# Patient Record
Sex: Male | Born: 1980 | Race: White | Hispanic: No | Marital: Married | Smoking: Never smoker
Health system: Southern US, Community
[De-identification: ages and names within clinical notes are randomized; demographics above are authoritative.]

## PROBLEM LIST (undated history)

## (undated) DIAGNOSIS — K219 Gastro-esophageal reflux disease without esophagitis: Secondary | ICD-10-CM

## (undated) DIAGNOSIS — F419 Anxiety disorder, unspecified: Secondary | ICD-10-CM

## (undated) DIAGNOSIS — J45909 Unspecified asthma, uncomplicated: Secondary | ICD-10-CM

## (undated) HISTORY — DX: Gastro-esophageal reflux disease without esophagitis: K21.9

## (undated) HISTORY — DX: Unspecified asthma, uncomplicated: J45.909

## (undated) HISTORY — DX: Anxiety disorder, unspecified: F41.9

---
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
## 2003-07-22 HISTORY — PX: FINGER DEBRIDEMENT: SHX1634

## 2008-05-11 ENCOUNTER — Emergency Department (HOSPITAL_COMMUNITY): Admission: EM | Admit: 2008-05-11 | Discharge: 2008-05-11 | Payer: Self-pay | Admitting: Emergency Medicine

## 2012-06-28 ENCOUNTER — Encounter (INDEPENDENT_AMBULATORY_CARE_PROVIDER_SITE_OTHER): Payer: Self-pay | Admitting: Surgery

## 2012-06-28 ENCOUNTER — Ambulatory Visit (INDEPENDENT_AMBULATORY_CARE_PROVIDER_SITE_OTHER): Payer: BC Managed Care – PPO | Admitting: Surgery

## 2012-06-28 VITALS — BP 126/80 | HR 76 | Resp 14 | Ht 70.0 in | Wt 263.0 lb

## 2012-06-28 DIAGNOSIS — J45909 Unspecified asthma, uncomplicated: Secondary | ICD-10-CM | POA: Insufficient documentation

## 2012-06-28 DIAGNOSIS — E669 Obesity, unspecified: Secondary | ICD-10-CM | POA: Insufficient documentation

## 2012-06-28 DIAGNOSIS — K620 Anal polyp: Secondary | ICD-10-CM

## 2012-06-28 DIAGNOSIS — K219 Gastro-esophageal reflux disease without esophagitis: Secondary | ICD-10-CM | POA: Insufficient documentation

## 2012-06-28 NOTE — Progress Notes (Signed)
Subjective:     Patient ID: Caleb Vega, male   DOB: 1981-04-29, 31 y.o.   MRN: [id number]  HPI  Caleb Vega  [birth year] [id number]  Patient Care Team: Clayborn Heron, MD as PCP - General (Family Medicine) Shirley Friar, MD as Consulting Physician (Gastroenterology)  This patient is a 31 y.o.male who presents today for surgical evaluation at the request of Dr. Bosie Clos.   Reason for visit: Mass in anal canal.  Pleasant male.   Has had a few episodes of bright red blood per rectum.  Persisted.  Also sometimes feels like a lump comes in and out with bowel movements.  Underwent colonoscopy.  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  Also found to have an atypical polyp in the anal canal.  Was sent to me for consideration of surgical removal.  Patient is rather active.  Moves his bowels 2-3 times a day since started Nexium for his reflux a few years ago.  However they are stable.  Can walk a few miles without difficulty.  No history of anal trauma.  No history of anal receptive sex.  No history of warts.  No history of STDs  Patient Active Problem List  Diagnosis  . Anal canal polyp  . GERD (gastroesophageal reflux disease)  . Asthma  . Obesity (BMI 30-39.9)    Past Medical History  Diagnosis Date  . GERD (gastroesophageal reflux disease)   . Asthma   . Anxiety     Past Surgical History  Procedure Date  . Wisdom tooth extraction 2001  . Finger debridement 07/2003    History   Social History  . Marital Status: Married    Spouse Name: N/A    Number of Children: N/A  . Years of Education: 12+   Occupational History  .      Works at [employer]   Social History Main Topics  . Smoking status: Never Smoker   . Smokeless tobacco: Not on file  . Alcohol Use: Yes     2 x monthly  . Drug Use: No  . Sexually Active: Not on file   Other Topics Concern  . Not on file   Social History Narrative  . No narrative on file      Family History  Problem Relation Age of Onset  . Lung cancer Maternal Grandfather   . Prostate cancer Paternal Grandfather     Current Outpatient Prescriptions  Medication Sig Dispense Refill  . esomeprazole (NEXIUM) 40 MG capsule Take 40 mg by mouth daily before breakfast.         Allergies  Allergen Reactions  . Codeine Nausea Only  . Oxycodone Nausea Only    BP 126/80  Pulse 76  Resp 14  Ht 5\' 10"  (1.778 m)  Wt 263 lb (119.296 kg)  BMI 37.74 kg/m2  No results found.   Review of Systems  Constitutional: Negative for fever, chills and diaphoresis.  HENT: Negative for nosebleeds, sore throat, facial swelling, mouth sores, trouble swallowing and ear discharge.   Eyes: Negative for photophobia, discharge and visual disturbance.  Respiratory: Negative for choking, chest tightness, shortness of breath and stridor.   Cardiovascular: Negative for chest pain and palpitations.  Gastrointestinal: Positive for diarrhea and anal bleeding. Negative for nausea, vomiting, abdominal pain, constipation, blood in stool, abdominal distention and rectal pain.  Genitourinary: Negative for dysuria, urgency, difficulty urinating and testicular pain.  Musculoskeletal: Negative for myalgias, back pain, arthralgias  and gait problem.  Skin: Negative for color change, pallor, rash and wound.  Neurological: Negative for dizziness, speech difficulty, weakness, numbness and headaches.  Hematological: Negative for adenopathy. Does not bruise/bleed easily.  Psychiatric/Behavioral: Negative for hallucinations, confusion and agitation.       Objective:   Physical Exam  Constitutional: He is oriented to person, place, and time. He appears well-developed and well-nourished. No distress.  HENT:  Head: Normocephalic.  Mouth/Throat: Oropharynx is clear and moist. No oropharyngeal exudate.  Eyes: Conjunctivae normal and EOM are normal. Pupils are equal, round, and reactive to light. No scleral  icterus.  Neck: Normal range of motion. Neck supple. No tracheal deviation present.  Cardiovascular: Normal rate, regular rhythm and intact distal pulses.   Pulmonary/Chest: Effort normal and breath sounds normal. No respiratory distress.  Abdominal: Soft. He exhibits no distension. There is no tenderness. Hernia confirmed negative in the right inguinal area and confirmed negative in the left inguinal area.  Genitourinary:       Exam done with assistance of male Medical Assistant in the room.  Perianal skin clean with good hygiene.  No pruritis.  No external skin tags / hemorrhoids of significance.  No pilonidal disease.  No fissure.  No abscess/fistula.    Tolerates digital and anoscopic rectal exam.  Normal sphincter tone.  Hemorrhoidal piles mildly enlarged R post>ant.  2cm polyp at base of R post hem pile - prolapses easily.  Hard/irregular tip   Musculoskeletal: Normal range of motion. He exhibits no tenderness.  Lymphadenopathy:    He has no cervical adenopathy.       Right: No inguinal adenopathy present.       Left: No inguinal adenopathy present.  Neurological: He is alert and oriented to person, place, and time. No cranial nerve deficit. He exhibits normal muscle tone. Coordination normal.  Skin: Skin is warm and dry. No rash noted. He is not diaphoretic. No erythema. No pallor.  Psychiatric: He has a normal mood and affect. His behavior is normal. Judgment and thought content normal.       Assessment:     Anal canal mass right posterior on top of hemorrhoidal pile.  Hopefully benign.    Plan:     Will I do think it is probably benign.  It is atypical in appearance.  The only way to know for certain what it is is to remove it.  Another option is to observe and see if it changes over the next year or so.  I more inclined to remove it.  Lithotomy position.  So is the patient.  I discussed the procedure:  The anatomy & physiology of the anorectal region was discussed.  The  pathophysiology of anorectal masses and differential diagnosis was discussed.  Natural history risks without surgery was discussed such as further growth and cancer.   I stressed the importance of office follow-up to catch early recurrence & minimize/halt progression of disease.  Interventions such as cauterization by topical agents were discussed.  The patient's symptoms are not adequately controlled by non-operative treatments.  I feel the risks & problems of no surgery outweigh the operative risks; therefore, I recommended surgery to treat the anal mass by removal.  Risks such as bleeding, infection, need for further treatment, heart attack, death, and other risks were discussed.   I noted a good likelihood this will help address the problem. Goals of post-operative recovery were discussed as well.  Possibility that this will not correct all symptoms was explained.  Post-operative pain, bleeding, constipation, and other problems after surgery were discussed.  We will work to minimize complications.   Educational handouts further explaining the pathology, treatment options, and bowel regimen were given as well.  Questions were answered.  The patient expresses understanding & wishes to proceed with surgery.  He was hoping to wait until the end of the year, May.  I am hesitant to wait that long.  He thinks there may be a window in January.  We will try and schedule it then

## 2012-06-28 NOTE — Patient Instructions (Addendum)
You have a polyp in your anal canal.  Hopefully benign.  Probably an atypical hemorrhoid.  We recommend removal.  Please call to schedule when you are ready.  GETTING TO GOOD BOWEL HEALTH. Irregular bowel habits such as constipation and diarrhea can lead to many problems over time.  Having one soft bowel movement a day is the most important way to prevent further problems.  The anorectal canal is designed to handle stretching and feces to safely manage our ability to get rid of solid waste (feces, poop, stool) out of our body.  BUT, hard constipated stools can act like ripping concrete bricks and diarrhea can be a burning fire to this very sensitive area of our body, causing inflamed hemorrhoids, anal fissures, increasing risk is perirectal abscesses, abdominal pain/bloating, an making irritable bowel worse.     The goal: ONE SOFT BOWEL MOVEMENT A DAY!  To have soft, regular bowel movements:    Drink at least 8 tall glasses of water a day.     Take plenty of fiber.  Fiber is the undigested part of plant food that passes into the colon, acting s "natures broom" to encourage bowel motility and movement.  Fiber can absorb and hold large amounts of water. This results in a larger, bulkier stool, which is soft and easier to pass. Work gradually over several weeks up to 6 servings a day of fiber (25g a day even more if needed) in the form of: o Vegetables -- Root (potatoes, carrots, turnips), leafy green (lettuce, salad greens, celery, spinach), or cooked high residue (cabbage, broccoli, etc) o Fruit -- Fresh (unpeeled skin & pulp), Dried (prunes, apricots, cherries, etc ),  or stewed ( applesauce)  o Whole grain breads, pasta, etc (whole wheat)  o Bran cereals    Bulking Agents -- This type of water-retaining fiber generally is easily obtained each day by one of the following:  o Psyllium bran -- The psyllium plant is remarkable because its ground seeds can retain so much water. This product is available as  Metamucil, Konsyl, Effersyllium, Per Diem Fiber, or the less expensive generic preparation in drug and health food stores. Although labeled a laxative, it really is not a laxative.  o Methylcellulose -- This is another fiber derived from wood which also retains water. It is available as Citrucel. o Polyethylene Glycol - and "artificial" fiber commonly called Miralax or Glycolax.  It is helpful for people with gassy or bloated feelings with regular fiber o Flax Seed - a less gassy fiber than psyllium   No reading or other relaxing activity while on the toilet. If bowel movements take longer than 5 minutes, you are too constipated   AVOID CONSTIPATION.  High fiber and water intake usually takes care of this.  Sometimes a laxative is needed to stimulate more frequent bowel movements, but    Laxatives are not a good long-term solution as it can wear the colon out. o Osmotics (Milk of Magnesia, Fleets phosphosoda, Magnesium citrate, MiraLax, GoLytely) are safer than  o Stimulants (Senokot, Castor Oil, Dulcolax, Ex Lax)    o Do not take laxatives for more than 7days in a row.    IF SEVERELY CONSTIPATED, try a Bowel Retraining Program: o Do not use laxatives.  o Eat a diet high in roughage, such as bran cereals and leafy vegetables.  o Drink six (6) ounces of prune or apricot juice each morning.  o Eat two (2) large servings of stewed fruit each day.  o  Take one (1) heaping tablespoon of a psyllium-based bulking agent twice a day. Use sugar-free sweetener when possible to avoid excessive calories.  o Eat a normal breakfast.  o Set aside 15 minutes after breakfast to sit on the toilet, but do not strain to have a bowel movement.  o If you do not have a bowel movement by the third day, use an enema and repeat the above steps.    Controlling diarrhea o Switch to liquids and simpler foods for a few days to avoid stressing your intestines further. o Avoid dairy products (especially milk & ice cream) for a  short time.  The intestines often can lose the ability to digest lactose when stressed. o Avoid foods that cause gassiness or bloating.  Typical foods include beans and other legumes, cabbage, broccoli, and dairy foods.  Every person has some sensitivity to other foods, so listen to our body and avoid those foods that trigger problems for you. o Adding fiber (Citrucel, Metamucil, psyllium, Miralax) gradually can help thicken stools by absorbing excess fluid and retrain the intestines to act more normally.  Slowly increase the dose over a few weeks.  Too much fiber too soon can backfire and cause cramping & bloating. o Probiotics (such as active yogurt, Align, etc) may help repopulate the intestines and colon with normal bacteria and calm down a sensitive digestive tract.  Most studies show it to be of mild help, though, and such products can be costly. o Medicines:   Bismuth subsalicylate (ex. Kayopectate, Pepto Bismol) every 30 minutes for up to 6 doses can help control diarrhea.  Avoid if pregnant.   Loperamide (Immodium) can slow down diarrhea.  Start with two tablets (4mg  total) first and then try one tablet every 6 hours.  Avoid if you are having fevers or severe pain.  If you are not better or start feeling worse, stop all medicines and call your doctor for advice o Call your doctor if you are getting worse or not better.  Sometimes further testing (cultures, endoscopy, X-ray studies, bloodwork, etc) may be needed to help diagnose and treat the cause of the diarrhea. o

## 2012-06-29 ENCOUNTER — Encounter (INDEPENDENT_AMBULATORY_CARE_PROVIDER_SITE_OTHER): Payer: Self-pay

## 2012-07-04 ENCOUNTER — Other Ambulatory Visit (INDEPENDENT_AMBULATORY_CARE_PROVIDER_SITE_OTHER): Payer: Self-pay | Admitting: Surgery

## 2012-09-05 DIAGNOSIS — D129 Benign neoplasm of anus and anal canal: Secondary | ICD-10-CM

## 2012-09-05 DIAGNOSIS — D128 Benign neoplasm of rectum: Secondary | ICD-10-CM

## 2012-09-07 ENCOUNTER — Telehealth (INDEPENDENT_AMBULATORY_CARE_PROVIDER_SITE_OTHER): Payer: Self-pay | Admitting: General Surgery

## 2012-09-07 NOTE — Telephone Encounter (Signed)
Pt called about a swelling that yesterday was the size of a pea and today is size of a grape.  It is protruding through his rectum.  He had anal polyp surgery at [facility] this week.  Pt reports he had BM yesterday but not yet today.  He is showering and soaking in warm tub water as well.  Pt wants to know what the swelling is and if it is ok.  Will contact Dr. Michaell Cowing and call pt back.

## 2012-09-07 NOTE — Telephone Encounter (Signed)
Called pt to discuss Dr. Gordy Savers response to his concern.  He was satisfied and will call back if any further concerns.

## 2012-09-07 NOTE — Telephone Encounter (Signed)
Postoperative swelling common.  Warm soaks/ice packs help.  Needs to be on high fiber w supplement & laxatives PRN to avoid constipation

## 2012-09-25 ENCOUNTER — Encounter (INDEPENDENT_AMBULATORY_CARE_PROVIDER_SITE_OTHER): Payer: Self-pay | Admitting: Surgery

## 2012-09-25 ENCOUNTER — Ambulatory Visit (INDEPENDENT_AMBULATORY_CARE_PROVIDER_SITE_OTHER): Payer: BC Managed Care – PPO | Admitting: Surgery

## 2012-09-25 VITALS — BP 122/88 | HR 88 | Temp 97.6°F | Resp 18 | Ht 70.0 in | Wt 263.2 lb

## 2012-09-25 DIAGNOSIS — K648 Other hemorrhoids: Secondary | ICD-10-CM

## 2012-09-25 DIAGNOSIS — K620 Anal polyp: Secondary | ICD-10-CM

## 2012-09-25 NOTE — Progress Notes (Signed)
Subjective:     Patient ID: Caleb Vega, male   DOB: [birth year], 32 y.o.   MRN: [id number]  HPI  ANTUAN LIMES  [birth year] [id number]  Patient Care Team: Clayborn Heron, MD as PCP - General (Family Medicine) Shirley Friar, MD as Consulting Physician (Gastroenterology)  This patient is a 32 y.o.male who presents today for surgical evaluation s/p excision of anal canal mass & hemorrhoidal ligation 09/05/2012   The patient comes in today feeling okay.  Still feels like something pulls and hurts with bowel movements or wiping.  Getting better now.  Having 3-4 bowel movements a day.  Somewhat loose.  That is his usual baseline.  Tylenol only helps take care of things.  No fevers chills or sweats.  Patient Active Problem List  Diagnosis  . Anal canal polyp  . GERD (gastroesophageal reflux disease)  . Asthma  . Obesity (BMI 30-39.9)  . Internal hemorrhoids with complication s/p ligation [id number]    Past Medical History  Diagnosis Date  . GERD (gastroesophageal reflux disease)   . Asthma   . Anxiety     Past Surgical History  Procedure Date  . Wisdom tooth extraction 2001  . Finger debridement 07/2003    History   Social History  . Marital Status: Married    Spouse Name: N/A    Number of Children: N/A  . Years of Education: 12+   Occupational History  .      Works at [employer]   Social History Main Topics  . Smoking status: Never Smoker   . Smokeless tobacco: Not on file  . Alcohol Use: Yes     Comment: 2 x monthly  . Drug Use: No  . Sexually Active: Not on file   Other Topics Concern  . Not on file   Social History Narrative  . No narrative on file    Family History  Problem Relation Age of Onset  . Lung cancer Maternal Grandfather   . Prostate cancer Paternal Grandfather     Current Outpatient Prescriptions  Medication Sig Dispense Refill  . esomeprazole (NEXIUM) 40 MG capsule Take 40 mg by mouth daily before breakfast.         Allergies  Allergen Reactions  . Codeine Nausea Only  . Oxycodone Nausea Only    BP 122/88  Pulse 88  Temp 97.6 F (36.4 C) (Temporal)  Resp 18  Ht 5\' 10"  (1.778 m)  Wt 263 lb 3.2 oz (119.387 kg)  BMI 37.77 kg/m2  No results found.   Review of Systems  Constitutional: Negative for fever, chills and diaphoresis.  HENT: Negative for sore throat, trouble swallowing and neck pain.   Eyes: Negative for photophobia and visual disturbance.  Respiratory: Negative for choking and shortness of breath.   Cardiovascular: Negative for chest pain and palpitations.  Gastrointestinal: Positive for diarrhea and rectal pain. Negative for nausea, vomiting, constipation, blood in stool, abdominal distention and anal bleeding.  Genitourinary: Negative for dysuria, urgency, difficulty urinating and testicular pain.  Musculoskeletal: Negative for myalgias, arthralgias and gait problem.  Skin: Negative for color change and rash.  Neurological: Negative for dizziness, speech difficulty, weakness and numbness.  Hematological: Negative for adenopathy.  Psychiatric/Behavioral: Negative for hallucinations, confusion and agitation.       Objective:   Physical Exam  Constitutional: He is oriented to person, place, and time. He appears well-developed and well-nourished. No distress.  HENT:  Head: Normocephalic.  Mouth/Throat: Oropharynx is clear and moist.  No oropharyngeal exudate.  Eyes: Conjunctivae normal and EOM are normal. Pupils are equal, round, and reactive to light. No scleral icterus.  Neck: Normal range of motion. No tracheal deviation present.  Cardiovascular: Normal rate, normal heart sounds and intact distal pulses.   Pulmonary/Chest: Effort normal. No respiratory distress.  Abdominal: Soft. He exhibits no distension. There is no tenderness. Hernia confirmed negative in the right inguinal area and confirmed negative in the left inguinal area.       Incisions clean with normal healing  ridges.  No hernias  Genitourinary: Penis normal. No penile tenderness.       Perianal skin clean with good hygiene.  No pruritis.  No external skin tags / hemorrhoids of significance.  No pilonidal disease.  No fissure.  No abscess/fistula.   Normal sphincter tone.    Exposed vicryl stitch R post anal wound - trimmed - feels much better now   Musculoskeletal: Normal range of motion. He exhibits no tenderness.  Neurological: He is alert and oriented to person, place, and time. No cranial nerve deficit. He exhibits normal muscle tone. Coordination normal.  Skin: Skin is warm and dry. No rash noted. He is not diaphoretic.  Psychiatric: He has a normal mood and affect. His behavior is normal.       Assessment:     S/p excision of anal canal mass - benign, recovering well    Plan:     Increase activity as tolerated to regular activity.  Do not push through pain.  Diet as tolerated. Bowel regimen to avoid problems.Consider adding Kaopectate or each bedtime Imodium to slow things down a little bit.  Avoid exacerbation.  Return to clinic 3weeks, p.r.n. If totally healed/asymptomatic   Instructions discussed.  Followup with primary care physician for other health issues as would normally be done.  Questions answered.  The patient expressed understanding and appreciation

## 2012-09-25 NOTE — Patient Instructions (Addendum)
HEMORRHOIDS   The rectum is the last few inches of your colon, and it naturally stretches to hold stool.  Hemorrhoidal piles are natural clusters of blood vessels that help the rectum stretch to hold stool and allow bowel movements to eliminate feces.  Hemorrhoids are abnormally swollen blood vessels in the rectum.  Too much pressure in the rectum causes hemorrhoids by forcing blood to stretch and bulge the walls of the veins, sometimes even rupturing them.  Hemorrhoids can become like varicose veins you might see on a person's legs. When bulging hemorrhoidal veins are irritated, they can swell, burn, itch, become very painful, and bleed. Once the rectal veins have been stretched out and hemorrhoids created, they are difficult to get rid of completely and tend to recur with less straining than it took to cause them in the first place. Fortunately, good habits and simple medical treatment usually control hemorrhoids well, and surgery is only recommended in unusually severe cases. Some of the most frequent causes of hemorrhoids:    Constant sitting    Straining with bowel movements (from constipation or hard stools)    Diarrhea    Sitting on the toilet for a long time    Severe coughing    Childbirth    Heavy Lifting  Types of Hemorrhoids:    Internal hemorrhoids usually don't hurt or itch; they are deep inside the rectum and usually have no sensation. However, internal hemorrhoids can bleed.  Such bleeding should not be ignored and mask blood from a dangerous source like colorectal cancer, so persistent rectal bleeding should be investigated with a colonoscopy.    External hemorrhoids cause most of the symptoms - pain, burning, and itching. Unirritated hemorrhoids can look like small skin tags coming out of the anus.     Thrombosed hemorrhoids can form when a hemorrhoid blood vessel bursts and causes the hemorrhoid to swell.  A purple blood clot can form in it and become an excruciatingly painful lump  at the anus. Because of these unpleasant symptoms, immediate incision and drainage by a surgeon at an office visit can provide much relief of the pain.    PREVENTION Avoiding the causes listed in above will prevent most cases of hemorrhoids, but this advice is sometimes hard to follow:  How can you avoid sitting all day if you have a seated job? Also, we try to avoid coughing and diarrhea, but sometimes it's beyond your control.  Still, there are some practical hints to help:    If your main job activity is seated, always stand or walk during your breaks. Make it a point to stand and walk at least 5 minutes every hour and try to shift frequently in your chair to avoid direct rectal pressure.    Always exhale as you strain or lift. Don't hold your breath.    Treat coughing, diarrhea and constipation early since irritated hemorrhoids may soon follow.    Do not delay or try to prevent a bowel movement when the urge is present.   Exercise regularly (walking or jogging 60 minutes a day) to stimulate the bowels to move.   Avoid dry toilet paper when cleaning after bowel movements.  Moistened tissues such as baby wipes are less irritating.  Lightly pat the rectal area dry.  Using irrigating showers or bottle irrigation washing can more gently clean this sensitive area.   Keep the anal and genital area clean and  dry.  Talcum or baby powders can help   GET  YOUR STOOLS SOFT.   This is the most important way to prevent irritated hemorrhoids.  Hard stools are like sandpaper to the anorectal canal and will cause more problems.   The goal: ONE SOFT BOWEL MOVEMENT A DAY!  To have soft, regular bowel movements:    Drink at least 8 tall glasses of water a day.     AVOID CONSTIPATION    Take plenty of fiber.  Fiber is the undigested part of plant food that passes into the colon, acting s "natures broom" to encourage bowel motility and movement.  Fiber can absorb and hold large amounts of water. This results in a  larger, bulkier stool, which is soft and easier to pass. Work gradually over several weeks up to 6 servings a day of fiber (25g a day even more if needed) in the form of: o Vegetables -- Root (potatoes, carrots, turnips), leafy green (lettuce, salad greens, celery, spinach), or cooked high residue (cabbage, broccoli, etc) o Fruit -- Fresh (unpeeled skin & pulp), Dried (prunes, apricots, cherries, etc ),  or stewed ( applesauce)  o Whole grain breads, pasta, etc (whole wheat)  o Bran cereals    Bulking Agents -- This type of water-retaining fiber generally is easily obtained each day by one of the following:  o Psyllium bran -- The psyllium plant is remarkable because its ground seeds can retain so much water. This product is available as Metamucil, Konsyl, Effersyllium, Per Diem Fiber, or the less expensive generic preparation in drug and health food stores. Although labeled a laxative, it really is not a laxative.  o Methylcellulose -- This is another fiber derived from wood which also retains water. It is available as Citrucel. o Polyethylene Glycol - and "artificial" fiber commonly called Miralax or Glycolax.  It is helpful for people with gassy or bloated feelings with regular fiber o Flax Seed - a less gassy fiber than psyllium   No reading or other relaxing activity while on the toilet. If bowel movements take longer than 5 minutes, you are too constipated   Laxatives can be useful for a short period if constipation is severe o Osmotics (Milk of Magnesia, Fleets phosphosoda, Magnesium citrate, MiraLax, GoLytely) are safer than  o Stimulants (Senokot, Castor Oil, Dulcolax, Ex Lax)    o Do not take laxatives for more than 7days in a row.   Laxatives are not a good long-term solution as it can stress the intestine and colon and causes too much mineral and fluid losses.    If badly constipated, try a Bowel Retraining Program: o Do not use laxatives.  o Eat a diet high in roughage, such as bran  cereals and leafy vegetables.  o Drink six (6) ounces of prune or apricot juice each morning.  o Eat two (2) large servings of stewed fruit each day.  o Take one (1) heaping dose of a bulking agent (ex. Metamucil, Citrucel, Miralax) twice a day.  o Use sugar-free sweetener when possible to avoid excessive calories.  o Eat a normal breakfast.  o Set aside 15 minutes after breakfast to sit on the toilet, but do not strain to have a bowel movement.  o If you do not have a bowel movement by the third day, use an enema and repeat the above steps.    AVOID DIARRHEA o Switch to liquids and simpler foods for a few days to avoid stressing your intestines further. o Avoid dairy products (especially milk & ice cream) for a short  time.  The intestines often can lose the ability to digest lactose when stressed. o Avoid foods that cause gassiness or bloating.  Typical foods include beans and other legumes, cabbage, broccoli, and dairy foods.  Every person has some sensitivity to other foods, so listen to our body and avoid those foods that trigger problems for you. o Adding fiber (Citrucel, Metamucil, psyllium, Miralax) gradually can help thicken stools by absorbing excess fluid and retrain the intestines to act more normally.  Slowly increase the dose over a few weeks.  Too much fiber too soon can backfire and cause cramping & bloating. o Probiotics (such as active yogurt, Align, etc) may help repopulate the intestines and colon with normal bacteria and calm down a sensitive digestive tract.  Most studies show it to be of mild help, though, and such products can be costly. o Medicines:   Bismuth subsalicylate (ex. Kayopectate, Pepto Bismol) every 30 minutes for up to 6 doses can help control diarrhea.  Avoid if pregnant.   Loperamide (Immodium) can slow down diarrhea.  Start with two tablets (4mg  total) first and then try one tablet every 6 hours.  Avoid if you are having fevers or severe pain.  If you are not  better or start feeling worse, stop all medicines and call your doctor for advice o Call your doctor if you are getting worse or not better.  Sometimes further testing (cultures, endoscopy, X-ray studies, bloodwork, etc) may be needed to help diagnose and treat the cause of the diarrhea.   If these preventive measures fail, you must take action right away! Hemorrhoids are one condition that can be mild in the morning and become intolerable by nightfall.       GETTING TO GOOD BOWEL HEALTH. Irregular bowel habits such as constipation and diarrhea can lead to many problems over time.  Having one soft bowel movement a day is the most important way to prevent further problems.  The anorectal canal is designed to handle stretching and feces to safely manage our ability to get rid of solid waste (feces, poop, stool) out of our body.  BUT, hard constipated stools can act like ripping concrete bricks and diarrhea can be a burning fire to this very sensitive area of our body, causing inflamed hemorrhoids, anal fissures, increasing risk is perirectal abscesses, abdominal pain/bloating, an making irritable bowel worse.     The goal: ONE SOFT BOWEL MOVEMENT A DAY!  To have soft, regular bowel movements:    Drink at least 8 tall glasses of water a day.     Take plenty of fiber.  Fiber is the undigested part of plant food that passes into the colon, acting s "natures broom" to encourage bowel motility and movement.  Fiber can absorb and hold large amounts of water. This results in a larger, bulkier stool, which is soft and easier to pass. Work gradually over several weeks up to 6 servings a day of fiber (25g a day even more if needed) in the form of: o Vegetables -- Root (potatoes, carrots, turnips), leafy green (lettuce, salad greens, celery, spinach), or cooked high residue (cabbage, broccoli, etc) o Fruit -- Fresh (unpeeled skin & pulp), Dried (prunes, apricots, cherries, etc ),  or stewed ( applesauce)   o Whole grain breads, pasta, etc (whole wheat)  o Bran cereals    Bulking Agents -- This type of water-retaining fiber generally is easily obtained each day by one of the following:  o Psyllium bran -- The psyllium  plant is remarkable because its ground seeds can retain so much water. This product is available as Metamucil, Konsyl, Effersyllium, Per Diem Fiber, or the less expensive generic preparation in drug and health food stores. Although labeled a laxative, it really is not a laxative.  o Methylcellulose -- This is another fiber derived from wood which also retains water. It is available as Citrucel. o Polyethylene Glycol - and "artificial" fiber commonly called Miralax or Glycolax.  It is helpful for people with gassy or bloated feelings with regular fiber o Flax Seed - a less gassy fiber than psyllium   No reading or other relaxing activity while on the toilet. If bowel movements take longer than 5 minutes, you are too constipated   AVOID CONSTIPATION.  High fiber and water intake usually takes care of this.  Sometimes a laxative is needed to stimulate more frequent bowel movements, but    Laxatives are not a good long-term solution as it can wear the colon out. o Osmotics (Milk of Magnesia, Fleets phosphosoda, Magnesium citrate, MiraLax, GoLytely) are safer than  o Stimulants (Senokot, Castor Oil, Dulcolax, Ex Lax)    o Do not take laxatives for more than 7days in a row.    IF SEVERELY CONSTIPATED, try a Bowel Retraining Program: o Do not use laxatives.  o Eat a diet high in roughage, such as bran cereals and leafy vegetables.  o Drink six (6) ounces of prune or apricot juice each morning.  o Eat two (2) large servings of stewed fruit each day.  o Take one (1) heaping tablespoon of a psyllium-based bulking agent twice a day. Use sugar-free sweetener when possible to avoid excessive calories.  o Eat a normal breakfast.  o Set aside 15 minutes after breakfast to sit on the toilet, but do  not strain to have a bowel movement.  o If you do not have a bowel movement by the third day, use an enema and repeat the above steps.    Controlling diarrhea o Switch to liquids and simpler foods for a few days to avoid stressing your intestines further. o Avoid dairy products (especially milk & ice cream) for a short time.  The intestines often can lose the ability to digest lactose when stressed. o Avoid foods that cause gassiness or bloating.  Typical foods include beans and other legumes, cabbage, broccoli, and dairy foods.  Every person has some sensitivity to other foods, so listen to our body and avoid those foods that trigger problems for you. o Adding fiber (Citrucel, Metamucil, psyllium, Miralax) gradually can help thicken stools by absorbing excess fluid and retrain the intestines to act more normally.  Slowly increase the dose over a few weeks.  Too much fiber too soon can backfire and cause cramping & bloating. o Probiotics (such as active yogurt, Align, etc) may help repopulate the intestines and colon with normal bacteria and calm down a sensitive digestive tract.  Most studies show it to be of mild help, though, and such products can be costly. o Medicines:   Bismuth subsalicylate (ex. Kayopectate, Pepto Bismol) every 30 minutes for up to 6 doses can help control diarrhea.  Avoid if pregnant.   Loperamide (Immodium) can slow down diarrhea.  Start with two tablets (4mg  total) first and then try one tablet every 6 hours.  Avoid if you are having fevers or severe pain.  If you are not better or start feeling worse, stop all medicines and call your doctor for advice o Call  your doctor if you are getting worse or not better.  Sometimes further testing (cultures, endoscopy, X-ray studies, bloodwork, etc) may be needed to help diagnose and treat the cause of the diarrhea.

## 2014-01-10 ENCOUNTER — Encounter (INDEPENDENT_AMBULATORY_CARE_PROVIDER_SITE_OTHER): Payer: Self-pay

## 2014-01-10 ENCOUNTER — Encounter: Payer: Self-pay | Admitting: Neurology

## 2014-01-10 ENCOUNTER — Ambulatory Visit (INDEPENDENT_AMBULATORY_CARE_PROVIDER_SITE_OTHER): Payer: BC Managed Care – PPO | Admitting: Neurology

## 2014-01-10 VITALS — BP 136/96 | HR 81 | Temp 99.1°F | Ht 70.0 in | Wt 280.0 lb

## 2014-01-10 DIAGNOSIS — G4763 Sleep related bruxism: Secondary | ICD-10-CM

## 2014-01-10 DIAGNOSIS — G2581 Restless legs syndrome: Secondary | ICD-10-CM

## 2014-01-10 DIAGNOSIS — R0609 Other forms of dyspnea: Secondary | ICD-10-CM

## 2014-01-10 DIAGNOSIS — R0989 Other specified symptoms and signs involving the circulatory and respiratory systems: Secondary | ICD-10-CM

## 2014-01-10 DIAGNOSIS — R0683 Snoring: Secondary | ICD-10-CM

## 2014-01-10 DIAGNOSIS — R51 Headache: Secondary | ICD-10-CM

## 2014-01-10 DIAGNOSIS — E669 Obesity, unspecified: Secondary | ICD-10-CM

## 2014-01-10 DIAGNOSIS — R519 Headache, unspecified: Secondary | ICD-10-CM

## 2014-01-10 NOTE — Progress Notes (Signed)
Subjective:    Vega ID: Caleb Vega is a 33 y.o. male.  HPI    Star Age, MD, PhD [facility] [address], [address] [place], [place] [address]  Dear Dr. Radene Ou,   I saw your Vega, Caleb Vega, upon your kind request in my neurologic clinic today for initial consultation of his sleep disorder, in particular, concern for obstructive sleep apnea. Caleb Vega is unaccompanied today. As you know, Caleb Vega is a 33 year old right-handed gentleman with an underlying medical history of reflux disease, asthma, anxiety, and obesity, who is required to have sleep evaluation for DOT requirements. He does report recurrent morning headaches and daytime somnolence. He snores mildly. His wife does not seem to complain about it. He's not sure if he has any breathing pauses while asleep and denies any overt gasping sensations while asleep.  His typical bedtime is reported to be around 9 to 10:30 PM and usual wake time is around 6:45 to 7:30 AM. Sleep onset typically occurs within 15-30 minutes. He reports feeling marginally rested upon awakening. He wakes up on an average 1 times in Caleb middle of Caleb night and has to go to Caleb bathroom 0 times on a typical night. He reports frequent morning headaches, which are L sided.  He denies frank excessive daytime somnolence (EDS) and His Epworth Sleepiness Score (ESS) is 5/24 today. He has not fallen asleep while driving. Caleb Vega has not been taking a planned nap. 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 He himself does not note any gasping sensations while asleep. Somehow or another he still does not wake up rested. He does achieve a good amount of sleep. His mother has sleep apnea and is on a CPAP machine. His mother also has RLS symptoms.  He has some restless leg symptoms and describes an aching sensation encouraged to move his right leg only. It helps to get up and walk around and stretch his right leg after which his symptoms subside typically. He grinds his teeth and has been using an over-Caleb-counter bite guard. He wakes up with nightmares sometimes. He does talk in his sleep. He does not walk in his sleep. He has acid reflux symptoms sometimes and often wakes up with a dry mouth from mouth breathing and has mucus. He does not typically suffer from postnasal drip. He drinks quite a bit of caffeine. He drinks soda approximately 4 times a day, and tries not to drink any caffeine after 4 PM. He does not smoke and rarely drinks alcohol.  His Past Medical History Is Significant For: Past Medical History  Diagnosis Date  . GERD (gastroesophageal reflux disease)   . Asthma   . Anxiety     His Past Surgical History Is Significant For: Past Surgical History  Procedure Laterality Date  . Wisdom tooth extraction  2001  . Finger debridement  07/2003    His Family History Is Significant For: Family History  Problem Relation Age of Onset  . Lung cancer Maternal Grandfather   . Prostate cancer Paternal Grandfather   . Sleep apnea Mother   . Diabetes Paternal Grandmother   . Arthritis Father     His Social History Is Significant For: History   Social History  . Marital Status: Married    Spouse Name:  N/A    Number of Children: N/A  . Years of Education: 12+   Occupational History  .      Works at [employer] Topics  . Smoking status: Never Smoker   . Smokeless tobacco: None  . Alcohol Use: Yes     Comment: 2 x monthly  . Drug Use: No  . Sexual Activity: None   Other Topics Concern  . None   Social History Narrative  . None    His Allergies Are:  Allergies  Allergen Reactions  . Codeine Nausea Only  . Oxycodone Nausea Only  :   His Current Medications Are:  Outpatient  Encounter Prescriptions as of 01/10/2014  Medication Sig  . esomeprazole (NEXIUM) 40 MG capsule Take 40 mg by mouth daily before breakfast.  . Multiple Vitamin (MULTIVITAMIN) tablet Take 1 tablet by mouth daily.  :  Review of Systems:  Out of a complete 14 point review of systems, all are reviewed and negative with Caleb exception of these symptoms as listed below:   Review of Systems  Constitutional: Positive for fatigue.  Eyes: Negative.   Respiratory: Negative.   Cardiovascular: Positive for chest pain.  Gastrointestinal: Negative.   Endocrine: Negative.   Genitourinary: Negative.   Musculoskeletal: Negative.   Skin: Negative.   Allergic/Immunologic: Negative.   Neurological: Positive for headaches.  Hematological: Negative.   Psychiatric/Behavioral: Positive for sleep disturbance (restless leg).    Objective:  Neurologic Exam  Physical Exam Physical Examination:   Filed Vitals:   01/10/14 1100  BP: 136/96  Pulse: 81  Temp: 99.1 F (37.3 C)    General Examination: Caleb Vega is a very pleasant 33 y.o. male in no acute distress. He appears well-developed and well-nourished and well groomed.   HEENT: Normocephalic, atraumatic, pupils are equal, round and reactive to light and accommodation. Funduscopic exam is normal with sharp disc margins noted. Extraocular tracking is good without limitation to gaze excursion or nystagmus noted. Normal smooth pursuit is noted. Hearing is grossly intact. Tympanic membranes are clear bilaterally. Face is symmetric with normal facial animation and normal facial sensation. Speech is clear with no dysarthria noted. There is no hypophonia. There is no lip, neck/head, jaw or voice tremor. Neck is supple with full range of passive and active motion. There are no carotid bruits on auscultation. Oropharynx exam reveals: No significant mouth dryness, good dental hygiene and mild to perhaps moderate airway crowding, due to thicker tone, and elongated  uvula as well as tonsils present. Mallampati is class II. Tongue protrudes centrally and palate elevates symmetrically. Tonsils are 1+ in size. Neck size is 17.75 inches.   Chest: Clear to auscultation without wheezing, rhonchi or crackles noted.  Heart: S1+S2+0, regular and normal without murmurs, rubs or gallops noted.   Abdomen: Soft, non-tender and non-distended with normal bowel sounds appreciated on auscultation.  Extremities: There is no pitting edema in Caleb distal lower extremities bilaterally. Pedal pulses are intact.  Skin: Warm and dry without trophic changes noted. There are no varicose veins.  Musculoskeletal: exam reveals no obvious joint deformities, tenderness or joint swelling or erythema.   Neurologically:  Mental status: Caleb Vega is awake, alert and oriented in all 4 spheres. His immediate and remote memory, attention, language skills and fund of knowledge are appropriate. There is no evidence of aphasia, agnosia, apraxia or anomia. Speech is clear with normal prosody and enunciation. Thought process is linear. Mood is normal and affect is normal.  Cranial nerves II - XII are as described above under HEENT exam. In addition: shoulder shrug is normal with equal shoulder height noted. Motor exam: Normal bulk, strength and tone is noted. There is no drift, tremor or rebound. Romberg is negative. Reflexes are 2+ throughout. Babinski: Toes are flexor bilaterally. Fine motor skills and coordination: intact with normal finger taps, normal hand movements, normal rapid alternating patting, normal foot taps and normal foot agility.  Cerebellar testing: No dysmetria or intention tremor on finger to nose testing. Heel to shin is unremarkable bilaterally. There is no truncal or gait ataxia.  Sensory exam: intact to light touch, pinprick, vibration, temperature sense in Caleb upper and lower extremities.  Gait, station and balance: He stands easily. No veering to one side is noted. No  leaning to one side is noted. Posture is age-appropriate and stance is narrow based. Gait shows normal stride length and normal pace. No problems turning are noted. He turns en bloc. Tandem walk is unremarkable. Intact toe and heel stance is noted.               Assessment and Plan:   In summary, Caleb Vega is a very pleasant 33 y.o.-year old male with a history and physical exam concerning for obstructive sleep apnea (OSA), in that he is overweight, snores some, has a larger neck size, has a FHx of OSA and report AM HAs. While he does not have a telltale complaint of severe daytime somnolence, gasping for air and crescendo snoring, I think it is imperative that we check him for sleep apnea; per DOT requirements he also needs to have a sleep study documented. I had a long chat with Caleb Vega about my findings and Caleb diagnosis of OSA, its prognosis and treatment options. We talked about medical treatments, surgical interventions and non-pharmacological approaches. I explained in particular Caleb risks and ramifications of untreated moderate to severe OSA, especially with respect to developing cardiovascular disease down Caleb Road, including congestive heart failure, difficult to treat hypertension, cardiac arrhythmias, or stroke. Even type 2 diabetes has, in part, been linked to untreated OSA. Symptoms of untreated OSA include daytime sleepiness, memory problems, mood irritability and mood disorder such as depression and anxiety, lack of energy, as well as recurrent headaches, especially morning headaches. We talked about trying to maintain a healthy lifestyle in general, as well as Caleb importance of weight control. I encouraged Caleb Vega to eat healthy, exercise daily and keep well hydrated, to keep a scheduled bedtime and wake time routine, to not skip any meals and eat healthy snacks in between meals. I advised Caleb Vega not to drive when feeling sleepy. I recommended Caleb following at this time:  sleep study with potential positive airway pressure titration.  I explained Caleb sleep test procedure to Caleb Vega and also outlined possible surgical and non-surgical treatment options of OSA, including Caleb use of a custom-made dental device (which would require a referral to a specialist dentist or oral surgeon), upper airway surgical options, such as pillar implants, radiofrequency surgery, tongue base surgery, and UPPP (which would involve a referral to an ENT surgeon). Rarely, jaw surgery such as mandibular advancement may be considered.  I also explained Caleb CPAP treatment option to Caleb Vega, who indicated that he would be willing to try CPAP if Caleb need arises. I explained Caleb importance of being compliant with PAP treatment, not only for insurance purposes but primarily to improve His symptoms, and for Caleb Vega's long term health  benefit, including to reduce His cardiovascular risks. I answered all his questions today and Caleb Vega was in agreement. I would like to see him back after Caleb sleep study is completed and encouraged him to call with any interim questions, concerns, problems or updates.   Thank you very much for allowing me to participate in Caleb care of this nice Vega. If I can be of any further assistance to you please do not hesitate to call me at [phone].  Sincerely,   Star Age, MD, PhD

## 2014-01-10 NOTE — Patient Instructions (Signed)

## 2014-01-13 ENCOUNTER — Ambulatory Visit (INDEPENDENT_AMBULATORY_CARE_PROVIDER_SITE_OTHER): Payer: BC Managed Care – PPO | Admitting: Neurology

## 2014-01-13 DIAGNOSIS — E669 Obesity, unspecified: Secondary | ICD-10-CM

## 2014-01-13 DIAGNOSIS — R519 Headache, unspecified: Secondary | ICD-10-CM

## 2014-01-13 DIAGNOSIS — R51 Headache: Secondary | ICD-10-CM

## 2014-01-13 DIAGNOSIS — R0683 Snoring: Secondary | ICD-10-CM

## 2014-01-13 DIAGNOSIS — G2581 Restless legs syndrome: Secondary | ICD-10-CM

## 2014-01-13 DIAGNOSIS — G4763 Sleep related bruxism: Secondary | ICD-10-CM

## 2014-01-13 DIAGNOSIS — G4733 Obstructive sleep apnea (adult) (pediatric): Secondary | ICD-10-CM

## 2014-01-23 NOTE — Sleep Study (Signed)
See media tab for full report  

## 2014-01-25 ENCOUNTER — Telehealth: Payer: Self-pay | Admitting: Neurology

## 2014-01-25 DIAGNOSIS — G4733 Obstructive sleep apnea (adult) (pediatric): Secondary | ICD-10-CM

## 2014-01-25 NOTE — Telephone Encounter (Signed)
Please call and notify patient that the recent sleep study confirmed the diagnosis of OSA. He did well with CPAP during the study with significant improvement of the respiratory events. Therefore, I would like start the patient on CPAP at home. I placed the order in the chart.   Arrange for CPAP set up at home through a DME company of patient's choice and fax/route report to PCP and referring MD (if other than PCP). Please expedite as the patient has a DOT card which will expire.   The patient will also need a follow up appointment with me in 6-8 weeks post set up that has to be scheduled; help the patient schedule this (in a follow-up slot).   Please re-enforce the importance of compliance with treatment and the need for [place] to monitor compliance data.   Once you have spoken to the patient and scheduled the return appointment, you may close this encounter, thanks,   Star Age, MD, PhD [facility] ([place])

## 2014-01-30 NOTE — Telephone Encounter (Signed)
I called and spoke with the patient about his recent sleep study results.I informed the patient that the study confirmed the diagnosis of obstructive sleep apnea and he did well on CPAP during the night of his study with significant improvement of the respiratory events. I will fax his CPAP order to [facility] and fax a copy of the report to Dr. Charlesetta Ivory office and to the patient along with a follow up instruction letter.

## 2014-01-31 ENCOUNTER — Encounter: Payer: Self-pay | Admitting: *Deleted

## 2014-04-19 ENCOUNTER — Encounter: Payer: Self-pay | Admitting: Neurology

## 2014-04-30 NOTE — Progress Notes (Signed)
Quick Note:  I reviewed the patient's CPAP compliance data from 03/19/2014 to 04/17/2014, which is a total of 30 days, during which time the patient used CPAP every day. The average usage for all days was 7 hours and 37 minutes. The percent used days greater than 4 hours was 93 %, indicating excellent compliance. The residual AHI was 1.8 per hour, indicating an adequate treatment pressure of 9 cwp with EPR of 1. Air leak from the mask was very low. I will review this data with the patient at the next office visit, which is currently not scheduled. We will get in touch with the patient so he will make a followup appointment.  Star Age, MD, PhD [facility] ([facility])   ______

## 2014-07-01 ENCOUNTER — Encounter: Payer: Self-pay | Admitting: Neurology

## 2014-07-17 NOTE — Progress Notes (Signed)
Quick Note:  I reviewed the patient's CPAP compliance data from 06/01/2014 to 06/30/2014, which is a total of 30 days, during which time the patient used CPAP every day except for 2 days. The average usage for all days was 7 hours and 17 minutes. The percent used days greater than 4 hours was 90 %, indicating excellent compliance. The residual AHI was 2.8 per hour, indicating an appropriate treatment pressure of 9 cwp with EPR of 1. Air leak from the mask was acceptable and typically low. I will review this data with the patient at the next office visit, which is currently not scheduled. We will get in touch with the patient regarding his follow-up appointment at which time I will provide feedback and additional troubleshooting if need be.  Star Age, MD, PhD [facility] ([facility])   ______

## 2014-07-26 ENCOUNTER — Encounter: Payer: Self-pay | Admitting: Neurology

## 2014-07-29 ENCOUNTER — Encounter: Payer: Self-pay | Admitting: Neurology

## 2014-07-29 ENCOUNTER — Ambulatory Visit (INDEPENDENT_AMBULATORY_CARE_PROVIDER_SITE_OTHER): Payer: BC Managed Care – PPO | Admitting: Neurology

## 2014-07-29 VITALS — BP 129/82 | HR 91 | Temp 98.6°F | Ht 70.0 in | Wt 296.0 lb

## 2014-07-29 DIAGNOSIS — E669 Obesity, unspecified: Secondary | ICD-10-CM

## 2014-07-29 DIAGNOSIS — G4733 Obstructive sleep apnea (adult) (pediatric): Secondary | ICD-10-CM

## 2014-07-29 DIAGNOSIS — Z9989 Dependence on other enabling machines and devices: Principal | ICD-10-CM

## 2014-07-29 NOTE — Patient Instructions (Signed)
Please continue using your CPAP regularly. While your insurance requires that you use CPAP at least 4 hours each night on 70% of the nights, I recommend, that you not skip any nights and use it throughout the night if you can. Getting used to CPAP and staying with the treatment long term does take time and patience and discipline. Untreated obstructive sleep apnea when it is moderate to severe can have an adverse impact on cardiovascular health and raise her risk for heart disease, arrhythmias, hypertension, congestive heart failure, stroke and diabetes. Untreated obstructive sleep apnea causes sleep disruption, nonrestorative sleep, and sleep deprivation. This can have an impact on your day to day functioning and cause daytime sleepiness and impairment of cognitive function, memory loss, mood disturbance, and problems focussing. Using CPAP regularly can improve these symptoms.  You do not have to bring your machine next time. Just bring the SD card.   Keep up the good work! I will see you back in 6 months for sleep apnea check up, and if you continue to do well on CPAP I will see you once a year thereafter.

## 2014-07-29 NOTE — Progress Notes (Signed)
Subjective:    Patient ID: Caleb Vega is a 33 y.o. male.  HPI     Interim history:   Caleb Vega is a 33 year old right-handed gentleman with an underlying medical history of reflux disease, asthma, anxiety, and obesity, who presents for follow-up consultation of his obstructive sleep apnea. He is unaccompanied today. 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 He reported recurrent morning headaches and daytime somnolence with mild snoring reported. I asked him to return for a sleep study. He had a split-night sleep study on 01/13/2014 and went over his test results with him in detail today. His baseline sleep efficiency was reduced at 80.7% with a latency to sleep of 11 minutes and wake after sleep onset of 18 minutes with moderate sleep fragmentation noted. He had an elevated arousal index. He had elevated stage II sleep, 21.5% of slow-wave sleep, and absence of REM sleep. He had moderate snoring. Total AHI was 39.2 per hour. Baseline oxygen saturation was 93%, nadir was 88%. He was then titrated on CPAP. Sleep efficiency was 82.7%, latency to sleep of 17.5 minutes and wake after sleep onset of 36.5 minutes with mild sleep fragmentation noted. His arousal index was improved. There was an increased percentage of REM sleep. Average oxygen saturation was 94%, nadir of 84%. He had no significant periodic leg movements of sleep before or after CPAP. He was titrated from 5-9 cm of water. AHI was reduced to 0.7 per hour at the final pressure with supine REM sleep achieved.   I reviewed the patient's CPAP compliance data from 03/19/2014 to 04/17/2014, which is a total of 30 days, during which time the patient used CPAP every day. The average usage for all days was 7 hours and 37 minutes. The percent used days greater than 4 hours was 93 %, indicating excellent compliance. The residual AHI was  1.8 per hour, indicating an adequate treatment pressure of 9 cwp with EPR of 1. Air leak from the mask was very low.  I reviewed the patient's CPAP compliance data from 06/01/2014 to 06/30/2014, which is a total of 30 days, during which time the patient used CPAP every day except for 2 days. The average usage for all days was 7 hours and 17 minutes. The percent used days greater than 4 hours was 90 %, indicating excellent compliance. The residual AHI was 2.8 per hour, indicating an appropriate treatment pressure of 9 cwp with EPR of 1. Air leak from the mask was acceptable and typically low.  Today, I reviewed his compliance data from 06/26/2014 through 07/25/2014 which is a total of 30 days during which time he used his machine every night except for one night. Percent used days greater than 4 hours was 93%, indicating excellent compliance, residual AHI 2.4 per hour, leak low. Pressure at 9 cm with EPR of 1. Average usage of 7 hours and 37 minutes.  Today, he reports that he doing well, he sleeps better and more consolidate, feels well rested. He has some nasal soreness and mild congestion. His nocturia is less, his headaches in the mornings are gone.  His typical bedtime is reported to be around 9 to 10:30 PM and usual wake time is around 6:45 to 7:30 AM. Sleep onset typically occurs within 15-30 minutes. He reports feeling marginally rested upon awakening. He wakes up on an average 1 times in the middle of  the night and has to go to the bathroom 0 times on a typical night. He reports frequent morning headaches, which are L sided.   He denies frank excessive daytime somnolence (EDS) and His Epworth Sleepiness Score (ESS) is 5/24 today. He has not fallen asleep while driving. The patient has not been taking a planned nap. 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 He himself does not note any gasping sensations while asleep. Somehow or another he still does not wake up rested. He does achieve a good amount of sleep. His mother has sleep apnea and is on a CPAP machine. His mother also has RLS symptoms. He has some restless leg symptoms and describes an aching sensation encouraged to move his right leg only. It helps to get up and walk around and stretch his right leg after which his symptoms subside typically. He grinds his teeth and has been using an over-the-counter bite guard. He wakes up with nightmares sometimes. He does talk in his sleep. He does not walk in his sleep. He has acid reflux symptoms sometimes and often wakes up with a dry mouth from mouth breathing and has mucus. He does not typically suffer from postnasal drip. He drinks quite a bit of caffeine. He drinks soda approximately 4 times a day, and tries not to drink any caffeine after 4 PM. He does not smoke and rarely drinks alcohol.   His Past Medical History Is Significant For: Past Medical History  Diagnosis Date  . GERD (gastroesophageal reflux disease)   . Asthma   . Anxiety     His Past Surgical History Is Significant For: Past Surgical History  Procedure Laterality Date  . Wisdom tooth extraction  2001  . Finger debridement  07/2003    His Family History Is Significant For: Family History  Problem Relation Age of Onset  . Lung cancer Maternal Grandfather   . Prostate cancer Paternal Grandfather   . Sleep apnea Mother   . Diabetes Paternal Grandmother   . Arthritis Father     His Social History Is Significant For: History   Social History  . Marital Status: Married    Spouse Name: Caleb Vega    Number of Children: N/A  . Years of Education: 12+   Occupational History  .      Works at [employer]  . Smoking status: Never Smoker   . Smokeless tobacco: Never Used  . Alcohol Use: 0.0 oz/week     0 Not specified per week     Comment: 2 x monthly  . Drug Use: No  . Sexual Activity: None   Other Topics Concern  . None   Social History Narrative   Patient consumes 2-3 daily    His Allergies Are:  Allergies  Allergen Reactions  . Codeine Nausea Only  . Oxycodone Nausea Only  :   His Current Medications Are:  Outpatient Encounter Prescriptions as of 07/29/2014  Medication Sig  . Coenzyme Q10 (COQ-10 PO) Take by mouth daily.  Marland Kitchen esomeprazole (NEXIUM) 20 MG capsule Take 20 mg by mouth daily at 12 noon.  . Multiple Vitamin (MULTIVITAMIN) tablet Take 1 tablet by mouth daily.  . [DISCONTINUED] esomeprazole (NEXIUM) 40 MG capsule Take 40 mg by mouth daily before breakfast.  :  Review of Systems:  Out of a complete 14 point review of systems, all are reviewed and negative with the exception of these symptoms as listed below:   Review of Systems  Allergic/Immunologic: Positive for environmental allergies.    Objective:  Neurologic Exam  Physical Exam Physical Examination:   Filed Vitals:   07/29/14 0807  BP: 129/82  Pulse: 91  Temp: 98.6 F (37 C)    General Examination: The patient is a very pleasant 33 y.o. male in no acute distress. He appears well-developed and well-nourished and well groomed.   HEENT: Normocephalic, atraumatic, pupils are equal, round and reactive to light and accommodation. Funduscopic exam is normal with sharp disc margins noted. Extraocular tracking is good without limitation to gaze excursion or nystagmus noted. Normal smooth pursuit is noted. Hearing is grossly intact. Face is symmetric with normal facial animation and normal facial sensation. Speech is clear with no dysarthria noted. There is no hypophonia. There is no lip, neck/head, jaw or voice tremor. Neck is supple with full range of passive and active motion. There are no carotid bruits on auscultation. Oropharynx exam reveals: No significant mouth dryness, good dental hygiene and mild  to perhaps moderate airway crowding, due to thicker tone, and elongated uvula as well as tonsils present. Mallampati is class II. Tongue protrudes centrally and palate elevates symmetrically. Tonsils are 1+ in size.    Chest: Clear to auscultation without wheezing, rhonchi or crackles noted.  Heart: S1+S2+0, regular and normal without murmurs, rubs or gallops noted.   Abdomen: Soft, non-tender and non-distended with normal bowel sounds appreciated on auscultation.  Extremities: There is no pitting edema in the distal lower extremities bilaterally. Pedal pulses are intact.  Skin: Warm and dry without trophic changes noted. There are no varicose veins.  Musculoskeletal: exam reveals no obvious joint deformities, tenderness or joint swelling or erythema.   Neurologically:  Mental status: The patient is awake, alert and oriented in all 4 spheres. His immediate and remote memory, attention, language skills and fund of knowledge are appropriate. There is no evidence of aphasia, agnosia, apraxia or anomia. Speech is clear with normal prosody and enunciation. Thought process is linear. Mood is normal and affect is normal.  Cranial nerves II - XII are as described above under HEENT exam. In addition: shoulder shrug is normal with equal shoulder height noted. Motor exam: Normal bulk, strength and tone is noted. There is no drift, tremor or rebound. Romberg is negative. Reflexes are 2+ throughout. Babinski: Toes are flexor bilaterally. Fine motor skills and coordination: intact with normal finger taps, normal hand movements, normal rapid alternating patting, normal foot taps and normal foot agility.  Cerebellar testing: No dysmetria or intention tremor on finger to nose testing. Heel to shin is unremarkable bilaterally. There is no truncal or gait ataxia.  Sensory exam: intact to light touch, pinprick, vibration, temperature sense in the upper and lower extremities.  Gait, station and balance: He stands  easily. No veering to one side is noted. No leaning to one side is noted. Posture is age-appropriate and stance is narrow based. Gait shows normal stride length and normal pace. No problems turning are noted. He turns en bloc. Tandem walk is unremarkable. Intact toe and heel stance is noted.               Assessment and Plan:   In summary, Caleb Vega is a very pleasant 33 year old male with an underlying medical history of reflux disease, asthma, anxiety, and  obesity, who presents for follow-up consultation of his obstructive sleep apnea,now on treatment with CPAP therapy since May. We talked about his sleep study results from end of April. I explained his compliance data to him as well. He has unfortunately gained weight. He's working on weight loss.he reports great results with CPAP therapy and that his sleep is better consolidated, his daytime somnolence is better and his morning headaches are essentially gone. We talked about trying to maintain a healthy lifestyle in general, as well as the importance of weight control. I encouraged the patient to eat healthy, exercise daily and keep well hydrated, to keep a scheduled bedtime and wake time routine, to not skip any meals and eat healthy snacks in between meals. I advised the patient not to drive when feeling sleepy.I commended him for being compliant with CPAP therapy. I also explained the importance of being compliant with PAP treatment, not only for insurance purposes but primarily to improve His symptoms, and for the patient's long term health benefit, including to reduce His cardiovascular riskssince he is doing well I would like to see him back in 6 months from now, sooner if the need arises.I answered all his questions today and the patient was in agreement.

## 2014-09-23 ENCOUNTER — Encounter: Payer: Self-pay | Admitting: Neurology

## 2015-01-29 ENCOUNTER — Ambulatory Visit (INDEPENDENT_AMBULATORY_CARE_PROVIDER_SITE_OTHER): Payer: BLUE CROSS/BLUE SHIELD | Admitting: Neurology

## 2015-01-29 ENCOUNTER — Encounter: Payer: Self-pay | Admitting: Neurology

## 2015-01-29 VITALS — BP 134/82 | HR 78 | Resp 18 | Ht 70.0 in | Wt 280.0 lb

## 2015-01-29 DIAGNOSIS — R0981 Nasal congestion: Secondary | ICD-10-CM | POA: Diagnosis not present

## 2015-01-29 DIAGNOSIS — Z9989 Dependence on other enabling machines and devices: Principal | ICD-10-CM

## 2015-01-29 DIAGNOSIS — G4733 Obstructive sleep apnea (adult) (pediatric): Secondary | ICD-10-CM | POA: Diagnosis not present

## 2015-01-29 DIAGNOSIS — E669 Obesity, unspecified: Secondary | ICD-10-CM | POA: Diagnosis not present

## 2015-01-29 NOTE — Progress Notes (Signed)
Subjective:    Patient ID: Caleb Vega is a 34 y.o. male.  HPI     Interim history:  Caleb Vega is a 34 year old right-handed gentleman with an underlying medical history of reflux disease, asthma, anxiety, and obesity, who presents for follow-up consultation of his obstructive sleep apnea. He is unaccompanied today. I last saw him on 07/29/2014, at which time we discussed sleep test results. He was compliant with treatment and reported doing well, , he was sleeping better and felt more rested. He had some mild nasal soreness and congestion. His nocturia was improved, and his morning headaches were essentially gone.   Today, 01/29/2015: I reviewed his CPAP compliance data from 12/29/2014 through 01/27/2015 which is a total of 30 days during which time he used his machine every night except for 1 night. Percent used days greater than 4 hours was 93%, indicating excellent compliance with an average usage of 7 hours and 25 minutes, residual AHI good at 2 per hour and leak acceptable with the 95th percentile at 9.7 L/m on a pressure of 9 cm with EPR of 1.   Today, 01/29/2015: He reports doing well. In the interim, they had a baby boy. Caleb Vega is 60 months old now. He does wake up in the middle of the night once usually. The patient reports no new symptoms. He is compliant with treatment. He took a trip out of town and skipped using the machine that night but did not wake up well at the time. He has very rare morning headaches but sometimes secondary to sleep deprivation. He is trying to stay well-hydrated. He has ongoing good results with CPAP therapy.  Previously:   I first met him on 01/10/2014 at the request of his primary care physician, at which time he presented for a required evaluation secondary to DOT requirements. He reported recurrent morning headaches and daytime somnolence with mild snoring reported. I asked him to return for a sleep study.   He had a split-night sleep study on 01/13/2014  and went over his test results with him in detail today. His baseline sleep efficiency was reduced at 80.7% with a latency to sleep of 11 minutes and wake after sleep onset of 18 minutes with moderate sleep fragmentation noted. He had an elevated arousal index. He had elevated stage II sleep, 21.5% of slow-wave sleep, and absence of REM sleep. He had moderate snoring. Total AHI was 39.2 per hour. Baseline oxygen saturation was 93%, nadir was 88%. He was then titrated on CPAP. Sleep efficiency was 82.7%, latency to sleep of 17.5 minutes and wake after sleep onset of 36.5 minutes with mild sleep fragmentation noted. His arousal index was improved. There was an increased percentage of REM sleep. Average oxygen saturation was 94%, nadir of 84%. He had no significant periodic leg movements of sleep before or after CPAP. He was titrated from 5-9 cm of water. AHI was reduced to 0.7 per hour at the final pressure with supine REM sleep achieved.   I reviewed the patient's CPAP compliance data from 03/19/2014 to 04/17/2014, which is a total of 30 days, during which time the patient used CPAP every day. The average usage for all days was 7 hours and 37 minutes. The percent used days greater than 4 hours was 93 %, indicating excellent compliance. The residual AHI was 1.8 per hour, indicating an adequate treatment pressure of 9 cwp with EPR of 1. Air leak from the mask was very low.  I reviewed the patient's  CPAP compliance data from 06/01/2014 to 06/30/2014, which is a total of 30 days, during which time the patient used CPAP every day except for 2 days. The average usage for all days was 7 hours and 17 minutes. The percent used days greater than 4 hours was 90 %, indicating excellent compliance. The residual AHI was 2.8 per hour, indicating an appropriate treatment pressure of 9 cwp with EPR of 1. Air leak from the mask was acceptable and typically low.  I reviewed his compliance data from 06/26/2014 through 07/25/2014  which is a total of 30 days during which time he used his machine every night except for one night. Percent used days greater than 4 hours was 93%, indicating excellent compliance, residual AHI 2.4 per hour, leak low. Pressure at 9 cm with EPR of 1. Average usage of 7 hours and 37 minutes.   His typical bedtime is reported to be around 9 to 10:30 PM and usual wake time is around 6:45 to 7:30 AM. Sleep onset typically occurs within 15-30 minutes. He reports feeling marginally rested upon awakening. He wakes up on an average 1 times in the middle of the night and has to go to the bathroom 0 times on a typical night. He reports frequent morning headaches, which are L sided.   He denies frank excessive daytime somnolence (EDS) and His Epworth Sleepiness Score (ESS) is 5/24 today. He has not fallen asleep while driving. The patient has not been taking a planned nap. 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 He himself does not note any gasping sensations while asleep. Somehow or another he still does not wake up rested. He does achieve a good amount of sleep. His mother has sleep apnea and is on a CPAP machine. His mother also has RLS symptoms. He has some restless leg symptoms and describes an aching sensation encouraged to move his right leg only. It helps to get up and walk around and stretch his right leg after which his symptoms subside typically. He grinds his teeth and has been using an over-the-counter bite guard. He wakes up with nightmares sometimes. He does talk in his sleep. He does not walk in his sleep. He has acid reflux symptoms sometimes and often wakes up with a dry mouth from mouth breathing and has mucus. He does not typically suffer from postnasal drip. He drinks quite a bit of caffeine. He drinks soda  approximately 4 times a day, and tries not to drink any caffeine after 4 PM. He does not smoke and rarely drinks alcohol.  His Past Medical History Is Significant For: Past Medical History  Diagnosis Date  . GERD (gastroesophageal reflux disease)   . Asthma   . Anxiety     His Past Surgical History Is Significant For: Past Surgical History  Procedure Laterality Date  . Wisdom tooth extraction  2001  . Finger debridement  07/2003    His Family History Is Significant For: Family History  Problem Relation Age of Onset  . Lung cancer Maternal Grandfather   . Prostate cancer Paternal Grandfather   . Sleep apnea Mother   . Diabetes Paternal Grandmother   . Arthritis Father     His Social History Is Significant For: History   Social History  . Marital Status: Married    Spouse  Name: Youlanda Roys  . Number of Children: N/A  . Years of Education: 12+   Occupational History  .      Works at [employer] Topics  . Smoking status: Never Smoker   . Smokeless tobacco: Never Used  . Alcohol Use: 0.0 oz/week    0 Standard drinks or equivalent per week     Comment: 2 x monthly  . Drug Use: No  . Sexual Activity: Not on file   Other Topics Concern  . None   Social History Narrative   Patient consumes 2-3 caffeine drinks daily    His Allergies Are:  Allergies  Allergen Reactions  . Codeine Nausea Only  . Oxycodone Nausea Only  :   His Current Medications Are:  Outpatient Encounter Prescriptions as of 01/29/2015  Medication Sig  . esomeprazole (NEXIUM) 20 MG capsule Take 20 mg by mouth daily at 12 noon.  . Multiple Vitamin (MULTIVITAMIN) tablet Take 1 tablet by mouth daily.  . [DISCONTINUED] Coenzyme Q10 (COQ-10 PO) Take by mouth daily.   No facility-administered encounter medications on file as of 01/29/2015.  :  Review of Systems:  Out of a complete 14 point review of systems, all are reviewed and negative with the exception of these  symptoms as listed below:   Review of Systems  All other systems reviewed and are negative.   Objective:  Neurologic Exam  Physical Exam Physical Examination:   Filed Vitals:   01/29/15 1054  BP: 134/82  Pulse: 78  Resp: 18    General Examination: The patient is a very pleasant 34 y.o. male in no acute distress. He appears well-developed and well-nourished and well groomed.   HEENT: Normocephalic, atraumatic, pupils are equal, round and reactive to light and accommodation. Funduscopic exam is normal with sharp disc margins noted. Extraocular tracking is good without limitation to gaze excursion or nystagmus noted. Normal smooth pursuit is noted. Hearing is grossly intact. Face is symmetric with normal facial animation and normal facial sensation. Speech is clear with no dysarthria noted. There is no hypophonia. There is no lip, neck/head, jaw or voice tremor. Neck is supple with full range of passive and active motion. There are no carotid bruits on auscultation. Oropharynx exam reveals: No significant mouth dryness, good dental hygiene and mild to perhaps moderate airway crowding, due to thicker tone, and elongated uvula as well as tonsils present. Mallampati is class II. Tongue protrudes centrally and palate elevates symmetrically. Tonsils are 1+ in size.    Chest: Clear to auscultation without wheezing, rhonchi or crackles noted.  Heart: S1+S2+0, regular and normal without murmurs, rubs or gallops noted.   Abdomen: Soft, non-tender and non-distended with normal bowel sounds appreciated on auscultation.  Extremities: There is no pitting edema in the distal lower extremities bilaterally. Pedal pulses are intact.  Skin: Warm and dry without trophic changes noted. There are no varicose veins.  Musculoskeletal: exam reveals no obvious joint deformities, tenderness or joint swelling or erythema.   Neurologically:  Mental status: The patient is awake, alert and oriented in all 4  spheres. His immediate and remote memory, attention, language skills and fund of knowledge are appropriate. There is no evidence of aphasia, agnosia, apraxia or anomia. Speech is clear with normal prosody and enunciation. Thought process is linear. Mood is normal and affect is normal.  Cranial nerves II - XII are as described above under HEENT exam. In addition: shoulder shrug is normal with equal shoulder height  noted. Motor exam: Normal bulk, strength and tone is noted. There is no drift, tremor or rebound. Romberg is negative. Reflexes are 2+ throughout. Babinski: Toes are flexor bilaterally. Fine motor skills and coordination: intact with normal finger taps, normal hand movements, normal rapid alternating patting, normal foot taps and normal foot agility.  Cerebellar testing: No dysmetria or intention tremor on finger to nose testing. Heel to shin is unremarkable bilaterally. There is no truncal or gait ataxia.  Sensory exam: intact to light touch.  Gait, station and balance: He stands easily. No veering to one side is noted. No leaning to one side is noted. Posture is age-appropriate and stance is narrow based. Gait shows normal stride length and normal pace. No problems turning are noted. He turns en bloc. Tandem walk is unremarkable.   Assessment and Plan:   In summary, JUSTIS CLOSSER is a very pleasant 34 year old male with an underlying medical history of reflux disease, asthma, anxiety, and obesity, who presents for follow-up consultation of his obstructive sleep apnea, established well on treatment with CPAP since May 2015 with good results and excellent compliance. Physical exam is stable. He continues to feel well. He now has a 88-monthold boy, who currently wakes up once per night for about an hour between 2 and 3 AM. He has lost some weight and is congratulated on his weight loss. He is encouraged to strive for weight loss. Since starting CPAP therapy his sleep has been better consolidated,  his daytime somnolence is better and his morning headaches are improved as well as nocturia.  We did briefly review his sleep study from April last year and we also reviewed his most recent compliance data. He is congratulated on his treatment adherence. I explained to him the importance of being compliant with PAP treatment, not only for insurance purposes but primarily to improve His symptoms, and for the patient's long term health benefit, including to reduce His cardiovascular risks. Since he is doing well from my standpoint, I would like to see him back on a yearly basis. I renewed his CPAP supply order and we will fax this to his DME company, [employer]  I spent 20 minutes in total face-to-face time with the patient, more than 50% of which was spent in counseling and coordination of care, reviewing test results, reviewing medication and discussing or reviewing the diagnosis of OSA, its prognosis and treatment options.

## 2015-01-29 NOTE — Patient Instructions (Addendum)
Please continue using your CPAP regularly. While your insurance requires that you use CPAP at least 4 hours each night on 70% of the nights, I recommend, that you not skip any nights and use it throughout the night if you can. Getting used to CPAP and staying with the treatment long term does take time and patience and discipline. Untreated obstructive sleep apnea when it is moderate to severe can have an adverse impact on cardiovascular health and raise her risk for heart disease, arrhythmias, hypertension, congestive heart failure, stroke and diabetes. Untreated obstructive sleep apnea causes sleep disruption, nonrestorative sleep, and sleep deprivation. This can have an impact on your day to day functioning and cause daytime sleepiness and impairment of cognitive function, memory loss, mood disturbance, and problems focussing. Using CPAP regularly can improve these symptoms. Keep up the good work! I will see you back in 12 months.    

## 2015-12-30 DIAGNOSIS — L02416 Cutaneous abscess of left lower limb: Secondary | ICD-10-CM | POA: Diagnosis not present

## 2016-02-02 ENCOUNTER — Ambulatory Visit: Payer: BLUE CROSS/BLUE SHIELD | Admitting: Neurology

## 2016-02-04 ENCOUNTER — Ambulatory Visit (INDEPENDENT_AMBULATORY_CARE_PROVIDER_SITE_OTHER): Payer: BLUE CROSS/BLUE SHIELD | Admitting: Neurology

## 2016-02-04 ENCOUNTER — Encounter: Payer: Self-pay | Admitting: Neurology

## 2016-02-04 VITALS — BP 114/76 | HR 86 | Resp 18 | Ht 70.0 in | Wt 264.0 lb

## 2016-02-04 DIAGNOSIS — G4733 Obstructive sleep apnea (adult) (pediatric): Secondary | ICD-10-CM

## 2016-02-04 DIAGNOSIS — E669 Obesity, unspecified: Secondary | ICD-10-CM

## 2016-02-04 DIAGNOSIS — Z9989 Dependence on other enabling machines and devices: Principal | ICD-10-CM

## 2016-02-04 NOTE — Patient Instructions (Signed)

## 2016-02-04 NOTE — Progress Notes (Signed)
Subjective:    Patient ID: Caleb Vega is a 35 y.o. male.  HPI     Interim history:   Mr. [name] is a 35 year old right-handed gentleman with an underlying medical history of reflux disease, asthma, anxiety, and obesity, who presents for follow-up consultation of his obstructive sleep apnea. He is unaccompanied today. I last saw him on 01/29/2015, at which time he was doing well. He was compliant with treatment. He had no new complaints. I suggested a one-year checkup.  Today, 02/04/2016: I reviewed his CPAP compliance data from 01/04/2016 through 02/02/2016 which is a total of 30 days during which time he used his CPAP 28 days with percent used days greater than 4 hours at 93%, indicating excellent compliance with an average usage of 7 hours and 40 minutes, residual AHI 1.9 per hour, leak acceptable for the 95th percentile at 12 L/m on a pressure of 9 cm.  Today, 02/04/2016: He reports doing well, he is actively trying to lose weight, went down to 240 lb in 8/16. Had a job change as well, less travel, less stress. Stopped the Nexium and no H2 blocker now.   Previously:   I saw him on 07/29/2014, at which time we discussed sleep test results. He was compliant with treatment and reported doing well, , he was sleeping better and felt more rested. He had some mild nasal soreness and congestion. His nocturia was improved, and his morning headaches were essentially gone.   I reviewed his CPAP compliance data from 12/29/2014 through 01/27/2015 which is a total of 30 days during which time he used his machine every night except for 1 night. Percent used days greater than 4 hours was 93%, indicating excellent compliance with an average usage of 7 hours and 25 minutes, residual AHI good at 2 per hour and leak acceptable with the 95th percentile at 9.7 L/m on a pressure of 9 cm with EPR of 1.   I first met him on 01/10/2014 at the request of his primary care physician, at which time he presented for a  required evaluation secondary to DOT requirements. He reported recurrent morning headaches and daytime somnolence with mild snoring reported. I asked him to return for a sleep study.   He had a split-night sleep study on 01/13/2014 and went over his test results with him in detail today. His baseline sleep efficiency was reduced at 80.7% with a latency to sleep of 11 minutes and wake after sleep onset of 18 minutes with moderate sleep fragmentation noted. He had an elevated arousal index. He had elevated stage II sleep, 21.5% of slow-wave sleep, and absence of REM sleep. He had moderate snoring. Total AHI was 39.2 per hour. Baseline oxygen saturation was 93%, nadir was 88%. He was then titrated on CPAP. Sleep efficiency was 82.7%, latency to sleep of 17.5 minutes and wake after sleep onset of 36.5 minutes with mild sleep fragmentation noted. His arousal index was improved. There was an increased percentage of REM sleep. Average oxygen saturation was 94%, nadir of 84%. He had no significant periodic leg movements of sleep before or after CPAP. He was titrated from 5-9 cm of water. AHI was reduced to 0.7 per hour at the final pressure with supine REM sleep achieved.   I reviewed the patient's CPAP compliance data from 03/19/2014 to 04/17/2014, which is a total of 30 days, during which time the patient used CPAP every day. The average usage for all days was 7 hours and 37 minutes.  The percent used days greater than 4 hours was 93 %, indicating excellent compliance. The residual AHI was 1.8 per hour, indicating an adequate treatment pressure of 9 cwp with EPR of 1. Air leak from the mask was very low.  I reviewed the patient's CPAP compliance data from 06/01/2014 to 06/30/2014, which is a total of 30 days, during which time the patient used CPAP every day except for 2 days. The average usage for all days was 7 hours and 17 minutes. The percent used days greater than 4 hours was 90 %, indicating excellent  compliance. The residual AHI was 2.8 per hour, indicating an appropriate treatment pressure of 9 cwp with EPR of 1. Air leak from the mask was acceptable and typically low.  I reviewed his compliance data from 06/26/2014 through 07/25/2014 which is a total of 30 days during which time he used his machine every night except for one night. Percent used days greater than 4 hours was 93%, indicating excellent compliance, residual AHI 2.4 per hour, leak low. Pressure at 9 cm with EPR of 1. Average usage of 7 hours and 37 minutes.  His typical bedtime is reported to be around 9 to 10:30 PM and usual wake time is around 6:45 to 7:30 AM. Sleep onset typically occurs within 15-30 minutes. He reports feeling marginally rested upon awakening. He wakes up on an average 1 times in the middle of the night and has to go to the bathroom 0 times on a typical night. He reports frequent morning headaches, which are L sided.   He denies frank excessive daytime somnolence (EDS) and His Epworth Sleepiness Score (ESS) is 5/24 today. He has not fallen asleep while driving. The patient has not been taking a planned nap. 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 He himself does not note any gasping sensations while asleep. Somehow or another he still does not wake up rested. He does achieve a good amount of sleep. His mother has sleep apnea and is on a CPAP machine. His mother also has RLS symptoms. He has some restless leg symptoms and describes an aching sensation encouraged to move his right leg only. It helps to get up and walk around and stretch his right leg after which his symptoms subside typically. He grinds his teeth and has been using an over-the-counter bite guard. He wakes up with nightmares sometimes. He does talk in his sleep. He does not  walk in his sleep. He has acid reflux symptoms sometimes and often wakes up with a dry mouth from mouth breathing and has mucus. He does not typically suffer from postnasal drip. He drinks quite a bit of caffeine. He drinks soda approximately 4 times a day, and tries not to drink any caffeine after 4 PM. He does not smoke and rarely drinks alcohol.  His Past Medical History Is Significant For: Past Medical History  Diagnosis Date  . GERD (gastroesophageal reflux disease)   . Asthma   . Anxiety     His Past Surgical History Is Significant For: Past Surgical History  Procedure Laterality Date  . Wisdom tooth extraction  2001  . Finger debridement  07/2003    His Family History Is Significant For: Family History  Problem Relation Age of Onset  . Lung cancer Maternal Grandfather   .  Prostate cancer Paternal Grandfather   . Sleep apnea Mother   . Diabetes Paternal Grandmother   . Arthritis Father     His Social History Is Significant For: Social History   Social History  . Marital Status: Married    Spouse Name: Cana  . Number of Children: N/A  . Years of Education: 12+   Occupational History  .      Works at [employer] Topics  . Smoking status: Never Smoker   . Smokeless tobacco: Never Used  . Alcohol Use: 0.0 oz/week    0 Standard drinks or equivalent per week     Comment: 2 x monthly  . Drug Use: No  . Sexual Activity: Not Asked   Other Topics Concern  . None   Social History Narrative   Patient consumes 2-3 caffeine drinks daily    His Allergies Are:  Allergies  Allergen Reactions  . Androgens Other (See Comments)    Steroids aggravate an eye condition    . Codeine Nausea Only  . Oxycodone Nausea Only  :   His Current Medications Are:  Outpatient Encounter Prescriptions as of 02/04/2016  Medication Sig  . Multiple Vitamin (MULTIVITAMIN) tablet Take 1 tablet by mouth daily.  . [DISCONTINUED] esomeprazole (NEXIUM) 20  MG capsule Take 20 mg by mouth daily at 12 noon.   No facility-administered encounter medications on file as of 02/04/2016.  :  Review of Systems:  Out of a complete 14 point review of systems, all are reviewed and negative with the exception of these symptoms as listed below:   Review of Systems  Neurological:       No new concerns per patient. States that he is doing well with CPAP    Objective:  Neurologic Exam  Physical Exam Physical Examination:   Filed Vitals:   02/04/16 1133  BP: 114/76  Pulse: 86  Resp: 18   General Examination: The patient is a very pleasant 35 y.o. male in no acute distress. He appears well-developed and well-nourished and well groomed. Has lost weight.   HEENT: Normocephalic, atraumatic, pupils are equal, round and reactive to light and accommodation. Funduscopic exam is normal with sharp disc margins noted. Extraocular tracking is good without limitation to gaze excursion or nystagmus noted. Normal smooth pursuit is noted. Hearing is grossly intact. Face is symmetric with normal facial animation and normal facial sensation. Speech is clear with no dysarthria noted. There is no hypophonia. There is no lip, neck/head, jaw or voice tremor. Neck is supple with full range of passive and active motion. There are no carotid bruits on auscultation. Oropharynx exam reveals: No significant mouth dryness, good dental hygiene and mild to perhaps moderate airway crowding, due to thicker tone, and elongated uvula as well as tonsils present. Mallampati is class II. Tongue protrudes centrally and palate elevates symmetrically. Tonsils are 1+ in size.    Chest: Clear to auscultation without wheezing, rhonchi or crackles noted.  Heart: S1+S2+0, regular and normal without murmurs, rubs or gallops noted.   Abdomen: Soft, non-tender and non-distended with normal bowel sounds appreciated on auscultation.  Extremities: There is no pitting edema in the distal lower extremities  bilaterally. Pedal pulses are intact.  Skin: Warm and dry without trophic changes noted. There are no varicose veins.  Musculoskeletal: exam reveals no obvious joint deformities, tenderness or joint swelling or erythema.   Neurologically:  Mental status: The patient is awake, alert and oriented in all 4  spheres. His immediate and remote memory, attention, language skills and fund of knowledge are appropriate. There is no evidence of aphasia, agnosia, apraxia or anomia. Speech is clear with normal prosody and enunciation. Thought process is linear. Mood is normal and affect is normal.  Cranial nerves II - XII are as described above under HEENT exam. In addition: shoulder shrug is normal with equal shoulder height noted. Motor exam: Normal bulk, strength and tone is noted. There is no drift, tremor or rebound. Romberg is negative. Reflexes are 2+ throughout. Babinski: Toes are flexor bilaterally. Fine motor skills and coordination: intact with normal finger taps, normal hand movements, normal rapid alternating patting, normal foot taps and normal foot agility.  Cerebellar testing: No dysmetria or intention tremor on finger to nose testing. Heel to shin is unremarkable bilaterally. There is no truncal or gait ataxia.  Sensory exam: intact to light touch.  Gait, station and balance: He stands easily. No veering to one side is noted. No leaning to one side is noted. Posture is age-appropriate and stance is narrow based. Gait shows normal stride length and normal pace. No problems turning are noted. Tandem walk is unremarkable.   Assessment and Plan:   In summary, CHOYA TORNOW is a very pleasant 35 year old male with an underlying medical history of reflux disease, asthma, anxiety, and obesity, who presents for follow-up consultation of his obstructive sleep apnea, established well on treatment with CPAP since May 2015 with good results and excellent compliance. Physical exam is stable and he has lost  weight, for which he is commended. He continues to feel well. He has a 35-monthold boy, and he had a recent change in his job duties, less stress, better hours. He is encouraged to continue with CPAP therapy and encouraged to strive for weight loss. We will do a follow-up in one year. We talked a little bit about perhaps repeating sleep study testing if he continues to achieve significant weight loss by next year. I answered all his questions today and he was in agreement. He is up-to-date with his supplies, DME is [facility].  I spent 25 minutes in total face-to-face time with the patient, more than 50% of which was spent in counseling and coordination of care, reviewing test results, reviewing medication and discussing or reviewing the diagnosis of OSA, its prognosis and treatment options.

## 2016-03-16 DIAGNOSIS — H35711 Central serous chorioretinopathy, right eye: Secondary | ICD-10-CM | POA: Diagnosis not present

## 2016-03-20 DIAGNOSIS — B309 Viral conjunctivitis, unspecified: Secondary | ICD-10-CM | POA: Diagnosis not present

## 2016-03-23 DIAGNOSIS — B309 Viral conjunctivitis, unspecified: Secondary | ICD-10-CM | POA: Diagnosis not present

## 2016-06-07 DIAGNOSIS — J019 Acute sinusitis, unspecified: Secondary | ICD-10-CM | POA: Diagnosis not present

## 2016-06-18 DIAGNOSIS — G4733 Obstructive sleep apnea (adult) (pediatric): Secondary | ICD-10-CM | POA: Diagnosis not present

## 2016-06-23 DIAGNOSIS — Z23 Encounter for immunization: Secondary | ICD-10-CM | POA: Diagnosis not present

## 2016-08-10 DIAGNOSIS — M79644 Pain in right finger(s): Secondary | ICD-10-CM | POA: Diagnosis not present

## 2016-09-21 DIAGNOSIS — S5001XA Contusion of right elbow, initial encounter: Secondary | ICD-10-CM | POA: Diagnosis not present

## 2017-02-02 ENCOUNTER — Ambulatory Visit (INDEPENDENT_AMBULATORY_CARE_PROVIDER_SITE_OTHER): Payer: BLUE CROSS/BLUE SHIELD | Admitting: Neurology

## 2017-02-02 ENCOUNTER — Encounter: Payer: Self-pay | Admitting: Neurology

## 2017-02-02 VITALS — BP 138/84 | HR 86 | Resp 18 | Ht 70.0 in | Wt 275.0 lb

## 2017-02-02 DIAGNOSIS — R51 Headache: Secondary | ICD-10-CM | POA: Diagnosis not present

## 2017-02-02 DIAGNOSIS — R030 Elevated blood-pressure reading, without diagnosis of hypertension: Secondary | ICD-10-CM | POA: Diagnosis not present

## 2017-02-02 DIAGNOSIS — Z9989 Dependence on other enabling machines and devices: Secondary | ICD-10-CM

## 2017-02-02 DIAGNOSIS — R635 Abnormal weight gain: Secondary | ICD-10-CM | POA: Diagnosis not present

## 2017-02-02 DIAGNOSIS — R519 Headache, unspecified: Secondary | ICD-10-CM

## 2017-02-02 DIAGNOSIS — G4733 Obstructive sleep apnea (adult) (pediatric): Secondary | ICD-10-CM

## 2017-02-02 NOTE — Patient Instructions (Addendum)
Please remember, common headache triggers are: sleep deprivation, dehydration, overheating, stress, hypoglycemia or skipping meals and blood sugar fluctuations, excessive pain medications or excessive alcohol use or caffeine withdrawal. Some people have food triggers such as aged cheese, orange juice or chocolate, especially dark chocolate, or MSG (monosodium glutamate). Try to avoid these headache triggers as much possible. It may be helpful to keep a headache diary to figure out what makes your headaches worse or brings them on and what alleviates them. Some people report headache onset after exercise but studies have shown that regular exercise may actually prevent headaches from coming. If you have exercise-induced headaches, please make sure that you drink plenty of fluid before and after exercising and that you do not over do it and do not overheat.  We will do a 1 year check up for OSA.   We will do a brain scan, called MRI and call you with the test results. We will have to schedule you for this on a separate date. This test requires authorization from your insurance, and we will take care of the insurance process.

## 2017-02-02 NOTE — Progress Notes (Signed)
Subjective:    Patient ID: Caleb Vega is a 36 y.o. male.  HPI     Interim history:  Caleb Vega is a 36 year old right-handed gentleman with an underlying medical history of reflux disease, asthma, anxiety, and obesity, who presents for follow-up consultation of his obstructive sleep apnea. He is unaccompanied today. I last saw him on 02/04/2016, at which time he reported doing well. He was trying to lose weight. He had a change in his job, was traveling less, less stress. He was compliant with CPAP therapy.   Today, 02/02/2017 (all dictated new, as well as above notes, some dictation done in note pad or Word, outside of chart, may appear as copied):   I reviewed his CPAP compliance data from 01/02/2017 through 01/31/2017 which is a total of 30 days, during which time he used his CPAP 27 days with percent used days greater than 4 hours at 90%, indicating excellent compliance with an average usage of 8 hours and 24 minutes, residual AHI 1.7 per hour, leak acceptable with the 95th percentile at 10.6 L/m on a pressure of 9 cm. He reports Doing well with regards to his sleep apnea and CPAP usage. He has no complaints in that regard, very occasional mouth dryness but has not been using a humidifier for the past 9 months or so. He is fully compliant with treatment with the exception of the occasional lapse when he spends the night at his parent's house. He has had some recurrence of his morning headaches, usually they last for about an hour, dull, achy, typically only left-sided. When he first started using CPAP his headaches did improve. He tries to hydrate fairly well but this fluctuates. He also drinks caffeine in the form of [facility] typically 2 or 3 servings per day, some tea in lieu of soda sometimes. He does not drink coffee. His blood pressure tends to fluctuate, his weight has been fluctuating as well, today higher than before but not as high as his highest. He does not have a history of migraine  headaches.   The patient's allergies, current medications, family history, past medical history, past social history, past surgical history and problem list were reviewed and updated as appropriate.   Previously (copied from previous notes for reference):   I saw him on 01/29/2015, at which time he was doing well. He was compliant with treatment. He had no new complaints. I suggested a one-year checkup.   I reviewed his CPAP compliance data from 01/04/2016 through 02/02/2016 which is a total of 30 days during which time he used his CPAP 28 days with percent used days greater than 4 hours at 93%, indicating excellent compliance with an average usage of 7 hours and 40 minutes, residual AHI 1.9 per hour, leak acceptable for the 95th percentile at 12 L/m on a pressure of 9 cm.   I saw him on 07/29/2014, at which time we discussed sleep test results. He was compliant with treatment and reported doing well, , he was sleeping better and felt more rested. He had some mild nasal soreness and congestion. His nocturia was improved, and his morning headaches were essentially gone.    I reviewed his CPAP compliance data from 12/29/2014 through 01/27/2015 which is a total of 30 days during which time he used his machine every night except for 1 night. Percent used days greater than 4 hours was 93%, indicating excellent compliance with an average usage of 7 hours and 25 minutes, residual AHI good  at 2 per hour and leak acceptable with the 95th percentile at 9.7 L/m on a pressure of 9 cm with EPR of 1.    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 He reported recurrent morning headaches and daytime somnolence with mild snoring reported. I asked him to return for a sleep study.    He had a split-night sleep study on 01/13/2014 and went over his test results with him in detail today. His baseline sleep  efficiency was reduced at 80.7% with a latency to sleep of 11 minutes and wake after sleep onset of 18 minutes with moderate sleep fragmentation noted. He had an elevated arousal index. He had elevated stage II sleep, 21.5% of slow-wave sleep, and absence of REM sleep. He had moderate snoring. Total AHI was 39.2 per hour. Baseline oxygen saturation was 93%, nadir was 88%. He was then titrated on CPAP. Sleep efficiency was 82.7%, latency to sleep of 17.5 minutes and wake after sleep onset of 36.5 minutes with mild sleep fragmentation noted. His arousal index was improved. There was an increased percentage of REM sleep. Average oxygen saturation was 94%, nadir of 84%. He had no significant periodic leg movements of sleep before or after CPAP. He was titrated from 5-9 cm of water. AHI was reduced to 0.7 per hour at the final pressure with supine REM sleep achieved.    I reviewed the patient's CPAP compliance data from 03/19/2014 to 04/17/2014, which is a total of 30 days, during which time the patient used CPAP every day. The average usage for all days was 7 hours and 37 minutes. The percent used days greater than 4 hours was 93 %, indicating excellent compliance. The residual AHI was 1.8 per hour, indicating an adequate treatment pressure of 9 cwp with EPR of 1. Air leak from the mask was very low.   I reviewed the patient's CPAP compliance data from 06/01/2014 to 06/30/2014, which is a total of 30 days, during which time the patient used CPAP every day except for 2 days. The average usage for all days was 7 hours and 17 minutes. The percent used days greater than 4 hours was 90 %, indicating excellent compliance. The residual AHI was 2.8 per hour, indicating an appropriate treatment pressure of 9 cwp with EPR of 1. Air leak from the mask was acceptable and typically low.   I reviewed his compliance data from 06/26/2014 through 07/25/2014 which is a total of 30 days during which time he used his machine every  night except for one night. Percent used days greater than 4 hours was 93%, indicating excellent compliance, residual AHI 2.4 per hour, leak low. Pressure at 9 cm with EPR of 1. Average usage of 7 hours and 37 minutes.   His typical bedtime is reported to be around 9 to 10:30 PM and usual wake time is around 6:45 to 7:30 AM. Sleep onset typically occurs within 15-30 minutes. He reports feeling marginally rested upon awakening. He wakes up on an average 1 times in the middle of the night and has to go to the bathroom 0 times on a typical night. He reports frequent morning headaches, which are L sided.   He denies frank excessive daytime somnolence (EDS) and His Epworth Sleepiness Score (ESS) is 5/24 today. He has not fallen asleep while driving. The patient has not been taking a planned nap. 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 He himself does not note any gasping sensations while asleep. Somehow or another he still does not wake up rested. He does achieve a good amount of sleep. His mother has sleep apnea and is on a CPAP machine. His mother also has RLS symptoms. He has some restless leg symptoms and describes an aching sensation encouraged to move his right leg only. It helps to get up and walk around and stretch his right leg after which his symptoms subside typically. He grinds his teeth and has been using an over-the-counter bite guard. He wakes up with nightmares sometimes. He does talk in his sleep. He does not walk in his sleep. He has acid reflux symptoms sometimes and often wakes up with a dry mouth from mouth breathing and has mucus. He does not typically suffer from postnasal drip. He drinks quite a bit of caffeine. He drinks soda approximately 4 times a day, and tries not to drink any caffeine after 4 PM. He does  not smoke and rarely drinks alcohol.  His Past Medical History Is Significant For: Past Medical History:  Diagnosis Date  . Anxiety   . Asthma   . GERD (gastroesophageal reflux disease)     His Past Surgical History Is Significant For: Past Surgical History:  Procedure Laterality Date  . FINGER DEBRIDEMENT  07/2003  . WISDOM TOOTH EXTRACTION  2001    His Family History Is Significant For: Family History  Problem Relation Age of Onset  . Lung cancer Maternal Grandfather   . Prostate cancer Paternal Grandfather   . Sleep apnea Mother   . Diabetes Paternal Grandmother   . Arthritis Father     His Social History Is Significant For: Social History   Social History  . Marital status: Married    Spouse name: Cana  . Number of children: N/A  . Years of education: 12+   Occupational History  .  [employer]    Works at [employer]  . Smoking status: Never Smoker  . Smokeless tobacco: Never Used  . Alcohol use 0.0 oz/week     Comment: 2 x monthly  . Drug use: No  . Sexual activity: Not Asked   Other Topics Concern  . None   Social History Narrative   Patient consumes 2-3 caffeine drinks daily    His Allergies Are:  Allergies  Allergen Reactions  . Androgens Other (See Comments)    Steroids aggravate an eye condition    . Codeine Nausea Only  . Oxycodone Nausea Only  :   His Current Medications Are:  Outpatient Encounter Prescriptions as of 02/02/2017  Medication Sig  . Calcium Polycarbophil (FIBER-CAPS PO) Take by mouth.  . Cyanocobalamin (B-12 PO) Take by mouth.  . Multiple Vitamin (MULTIVITAMIN) tablet Take 1 tablet by mouth daily.  . ranitidine (ZANTAC) 150 MG tablet Take 150 mg by mouth 2 (two) times daily.   No facility-administered encounter medications on file as of 02/02/2017.   : Review of Systems:  Out of a complete 14 point review of systems, all are reviewed and negative with the exception of  these symptoms as listed below: Review of Systems  Neurological:       Patient states that he is doing well  with CPAP. He enjoys using it at night. Recently he is dealing with sinus issues.   Reports that he is still waking up with headaches, located on the back L side of his head. Occurs 3-4 days a week.     Objective:  Neurologic Exam  Physical Exam Physical Examination:   Vitals:   02/02/17 0931  BP: 138/84  Pulse: 86  Resp: 18    General Examination: The patient is a very pleasant 36 y.o. male in no acute distress. He appears well-developed and well-nourished and well groomed.   HEENT: Normocephalic, atraumatic, pupils are equal, round and reactive to light and accommodation. Extraocular tracking is good without limitation to gaze excursion or nystagmus noted. Normal smooth pursuit is noted. Hearing is grossly intact. Face is symmetric with normal facial animation and normal facial sensation. Speech is clear with no dysarthria noted. There is no hypophonia. There is no lip, neck/head, jaw or voice tremor. Oropharynx exam reveals: No significant mouth dryness, good dental hygiene and mild to perhaps moderate airway crowding. Mallampati is class II. Tongue protrudes centrally and palate elevates symmetrically. Tonsils are about 1+ in size.    Chest: Clear to auscultation without wheezing, rhonchi or crackles noted.  Heart: S1+S2+0, regular and normal without murmurs, rubs or gallops noted.   Abdomen: Soft, non-tender and non-distended with normal bowel sounds appreciated on auscultation.  Extremities: There is no pitting edema in the distal lower extremities bilaterally.   Skin: Warm and dry without trophic changes noted. There are no varicose veins.  Musculoskeletal: exam reveals no obvious joint deformities, tenderness or joint swelling or erythema.   Neurologically:  Mental status: The patient is awake, alert and oriented in all 4 spheres. His immediate and remote  memory, attention, language skills and fund of knowledge are appropriate. There is no evidence of aphasia, agnosia, apraxia or anomia. Speech is clear with normal prosody and enunciation. Thought process is linear. Mood is normal and affect is normal.  Cranial nerves II - XII are as described above under HEENT exam.  Motor exam: Normal bulk, strength and tone is noted. There is no drift, tremor or rebound. Romberg is negative. Reflexes are 1-2+ throughout. Fine motor skills and coordination: intact with normal finger taps, normal hand movements, normal rapid alternating patting, normal foot taps and normal foot agility.  Cerebellar testing: No dysmetria or intention tremor on finger to nose testing. Heel to shin is unremarkable bilaterally. There is no truncal or gait ataxia.  Sensory exam: intact to light touch.  Gait, station and balance: He stands easily. No veering to one side is noted. No leaning to one side is noted. Posture is age-appropriate and stance is narrow based. Gait shows normal stride length and normal pace. No problems turning are noted. Tandem walk is unremarkable.   Assessment and Plan:   In summary, RUPERT AZZARA is a very pleasant 36 year old male with an underlying medical history of reflux disease, asthma, anxiety, and obesity, who presents for follow-up consultation of his obstructive sleep apnea, established well on treatment with CPAP since May 2015 with good results and excellent compliance. Physical exam is stable with the exception of weight fluctuations. He also reports recurrence of his morning headaches which do not sound migrainous. He has mostly left-sided headaches which are dull and achy. These headaches actually improved when he first started using CPAP but he is still compliant with it. His blood pressure tends to fluctuate some but not particularly high, today higher than last  year. He is advised to talk to his primary care physician about this as well. I  suggested we proceed with a brain MRI with and without contrast to rule out a structural lesion as his headaches are one-sided only. Thankfully, he has a nonfocal neurological exam. We talked about headache triggers as well today. Blood pressure tends to fluctuate somewhat, today a little higher than last year. He is advised to talk to his primary care physician about this as well. He is advised to limit his caffeine intake and make sure he drinks enough water, he tries to rest well and have enough sleep time. His compliance data suggests this as well. He is commended for his CPAP adherence. He is advised to work on weight loss. We will call him with his MRI results. From my end of things, I can see him back in a year, sooner as needed. I answered all his questions today and he was in agreement. He is also up-to-date with his CPAP supplies. I spent 25 minutes in total face-to-face time with the patient, more than 50% of which was spent in counseling and coordination of care, reviewing test results, reviewing medication and discussing or reviewing the diagnosis of OSA, recurrent HAs, the prognosis and treatment options. Pertinent laboratory and imaging test results that were available during this visit with the patient were reviewed by me and considered in my medical decision making (see chart for details).

## 2017-02-03 ENCOUNTER — Telehealth: Payer: Self-pay | Admitting: Neurology

## 2017-02-03 ENCOUNTER — Other Ambulatory Visit: Payer: Self-pay | Admitting: *Deleted

## 2017-02-03 DIAGNOSIS — T1590XA Foreign body on external eye, part unspecified, unspecified eye, initial encounter: Secondary | ICD-10-CM

## 2017-02-03 NOTE — Telephone Encounter (Signed)
I called the patient and scheduled his MRI for 02/23/17 at our [place] mobile unit... I informed him to get his xray before his MRI appointment.

## 2017-02-03 NOTE — Telephone Encounter (Signed)
I called the patient and informed him to get a xray for his eyes at [place] at any time.. I told him I would call him this afternoon to schedule his MRI and before he has the MRI just needs to go to GI to get the xray done.

## 2017-02-03 NOTE — Telephone Encounter (Signed)
I called the patient to schedule his MRI for our mobile unit. 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 Please advise what you would like to do.Marland Kitchen

## 2017-02-03 NOTE — Telephone Encounter (Signed)
Can one of you call MRI tech and verify if we should do a screening CT head? I don't know what else to do.

## 2017-02-18 ENCOUNTER — Telehealth: Payer: Self-pay | Admitting: Neurology

## 2017-02-18 NOTE — Telephone Encounter (Signed)
Pt has a Mobile mri scheduled for next week and he needs to r/s please call to reschedule

## 2017-02-21 NOTE — Telephone Encounter (Signed)
The patient called again this morning and I spoke with him and rescheduled his MRI for 03/30/17.

## 2017-02-23 ENCOUNTER — Other Ambulatory Visit: Payer: BLUE CROSS/BLUE SHIELD

## 2017-03-24 ENCOUNTER — Ambulatory Visit
Admission: RE | Admit: 2017-03-24 | Discharge: 2017-03-24 | Disposition: A | Discharge status: Home / Self Care | Payer: BLUE CROSS/BLUE SHIELD | Source: Ambulatory Visit | Attending: Neurology | Admitting: Neurology

## 2017-03-24 DIAGNOSIS — Z01818 Encounter for other preprocedural examination: Secondary | ICD-10-CM | POA: Diagnosis not present

## 2017-03-24 DIAGNOSIS — T1590XA Foreign body on external eye, part unspecified, unspecified eye, initial encounter: Secondary | ICD-10-CM

## 2017-03-30 ENCOUNTER — Ambulatory Visit (INDEPENDENT_AMBULATORY_CARE_PROVIDER_SITE_OTHER): Payer: BLUE CROSS/BLUE SHIELD

## 2017-03-30 DIAGNOSIS — R519 Headache, unspecified: Secondary | ICD-10-CM

## 2017-03-30 DIAGNOSIS — R51 Headache: Secondary | ICD-10-CM | POA: Diagnosis not present

## 2017-03-30 DIAGNOSIS — R030 Elevated blood-pressure reading, without diagnosis of hypertension: Secondary | ICD-10-CM

## 2017-03-30 DIAGNOSIS — Z9989 Dependence on other enabling machines and devices: Secondary | ICD-10-CM | POA: Diagnosis not present

## 2017-03-30 DIAGNOSIS — R635 Abnormal weight gain: Secondary | ICD-10-CM | POA: Diagnosis not present

## 2017-03-30 DIAGNOSIS — G4733 Obstructive sleep apnea (adult) (pediatric): Secondary | ICD-10-CM

## 2017-03-30 MED ORDER — GADOPENTETATE DIMEGLUMINE 469.01 MG/ML IV SOLN: 20.0000 mL | Freq: Once | INTRAVENOUS | Status: AC | PRN

## 2017-04-01 ENCOUNTER — Telehealth: Payer: Self-pay | Admitting: *Deleted

## 2017-04-01 NOTE — Progress Notes (Signed)
Please call and advise the patient that the recent scan we did was within normal limits. We did a brain MRI w and wo contrast, which showed normal findings. In particular, there were no acute findings, such as a stroke, or mass or blood products or abn contrast uptake. No further action is required on this test at this time. Please remind patient to keep any upcoming appointments or tests and to call us with any interim questions, concerns, problems or updates. Thanks,  Star Age, MD, PhD

## 2017-04-01 NOTE — Telephone Encounter (Signed)
Spoke with patient and informed him that his MRI brain result was within normal limits. The brain MRI  showed normal findings. Advised him there were no acute findings, such as a stroke, or mass or blood products. Advised him that no further action is required and to keep any upcoming appointments. Advised he call us with any interim questions, concerns, problems or updates.  Patient verbalized understanding, appreciation and had no further questions.

## 2017-09-26 DIAGNOSIS — Z86010 Personal history of colonic polyps: Secondary | ICD-10-CM | POA: Diagnosis not present

## 2018-02-01 ENCOUNTER — Encounter: Payer: Self-pay | Admitting: Neurology

## 2018-02-06 ENCOUNTER — Ambulatory Visit: Payer: BLUE CROSS/BLUE SHIELD | Admitting: Neurology

## 2018-02-06 ENCOUNTER — Encounter: Payer: Self-pay | Admitting: Neurology

## 2018-02-06 VITALS — BP 112/80 | HR 76 | Ht 71.0 in | Wt 287.0 lb

## 2018-02-06 DIAGNOSIS — G4733 Obstructive sleep apnea (adult) (pediatric): Secondary | ICD-10-CM | POA: Diagnosis not present

## 2018-02-06 DIAGNOSIS — Z9989 Dependence on other enabling machines and devices: Secondary | ICD-10-CM

## 2018-02-06 DIAGNOSIS — R635 Abnormal weight gain: Secondary | ICD-10-CM

## 2018-02-06 NOTE — Patient Instructions (Signed)
Great job with the CPAP! Keep going with the full compliance and work on weight loss.  I will see you back in one year.

## 2018-02-06 NOTE — Progress Notes (Signed)
Subjective:    Patient ID: Caleb Vega is a 37 y.o. male.  HPI     Interim history:  Caleb Vega is a 37 year old right-handed gentleman with an underlying medical history of reflux disease, asthma, anxiety, and obesity, who presents for follow-up consultation of his obstructive sleep apnea, will established on CPAP therapy. The patient is unaccompanied today. I last saw him on 02/02/2017, at which time he was compliant with CPAP therapy. He had some recurrence of his morning headaches which were typically left-sided. His exam is nonfocal, nevertheless I suggested we proceed with a brain scan to rule out a structural cause of his one-sided headaches. His brain MRI with and without contrast on 03/30/2017 which showed normal findings. We called him with his test results.   Today, 02/02/2017 (all dictated new, as well as above notes, some dictation done in note pad or Word, outside of chart, may appear as copied):   I reviewed his CPAP compliance data from 01/03/2018 through 02/01/2018 which is a total of 30 days, during which time he used his CPAP every night with percent used days greater than 4 hours at 100%, indicating superb compliance with an average usage of 8 hours and 5 minutes, residual AHI at goal at 1.4 per hour, leak acceptable with the 95th percentile at 13.4 L/m on a pressure of 9 cm. He reports doing well, tries to drink more water, tries to add more vegetables to his diet, does admit that his weight tends to fluctuate, one summer he was able to lose 40 pounds. He is motivated to work on weight loss. He gets his supplies on a regular basis but unfortunately, his insurance does not cover frequent supplies. He is thinking about purchasing a CPAP cleaning machine. Headaches are actually better, he is trying to hydrate better.  The patient's allergies, current medications, family history, past medical history, past social history, past surgical history and problem list were reviewed and  updated as appropriate.    Previously (copied from previous notes for reference):    I saw him on 02/04/2016, at which time he reported doing well. He was trying to lose weight. He had a change in his job, was traveling less, less stress. He was compliant with CPAP therapy.    I reviewed his CPAP compliance data from 01/02/2017 through 01/31/2017 which is a total of 30 days, during which time he used his CPAP 27 days with percent used days greater than 4 hours at 90%, indicating excellent compliance with an average usage of 8 hours and 24 minutes, residual AHI 1.7 per hour, leak acceptable with the 95th percentile at 10.6 L/m on a pressure of 9 cm.  I saw him on 01/29/2015, at which time he was doing well. He was compliant with treatment. He had no new complaints. I suggested a one-year checkup.   I reviewed his CPAP compliance data from 01/04/2016 through 02/02/2016 which is a total of 30 days during which time he used his CPAP 28 days with percent used days greater than 4 hours at 93%, indicating excellent compliance with an average usage of 7 hours and 40 minutes, residual AHI 1.9 per hour, leak acceptable for the 95th percentile at 12 L/m on a pressure of 9 cm.   I saw him on 07/29/2014, at which time we discussed sleep test results. He was compliant with treatment and reported doing well, , he was sleeping better and felt more rested. He had some mild nasal soreness and congestion. His  nocturia was improved, and his morning headaches were essentially gone.    I reviewed his CPAP compliance data from 12/29/2014 through 01/27/2015 which is a total of 30 days during which time he used his machine every night except for 1 night. Percent used days greater than 4 hours was 93%, indicating excellent compliance with an average usage of 7 hours and 25 minutes, residual AHI good at 2 per hour and leak acceptable with the 95th percentile at 9.7 L/m on a pressure of 9 cm with EPR of 1.    I first met him on  01/10/2014 at the request of his primary care physician, at which time he presented for a required evaluation secondary to DOT requirements. He reported recurrent morning headaches and daytime somnolence with mild snoring reported. I asked him to return for a sleep study.    He had a split-night sleep study on 01/13/2014 and went over his test results with him in detail today. His baseline sleep efficiency was reduced at 80.7% with a latency to sleep of 11 minutes and wake after sleep onset of 18 minutes with moderate sleep fragmentation noted. He had an elevated arousal index. He had elevated stage II sleep, 21.5% of slow-wave sleep, and absence of REM sleep. He had moderate snoring. Total AHI was 39.2 per hour. Baseline oxygen saturation was 93%, nadir was 88%. He was then titrated on CPAP. Sleep efficiency was 82.7%, latency to sleep of 17.5 minutes and wake after sleep onset of 36.5 minutes with mild sleep fragmentation noted. His arousal index was improved. There was an increased percentage of REM sleep. Average oxygen saturation was 94%, nadir of 84%. He had no significant periodic leg movements of sleep before or after CPAP. He was titrated from 5-9 cm of water. AHI was reduced to 0.7 per hour at the final pressure with supine REM sleep achieved.    I reviewed the patient's CPAP compliance data from 03/19/2014 to 04/17/2014, which is a total of 30 days, during which time the patient used CPAP every day. The average usage for all days was 7 hours and 37 minutes. The percent used days greater than 4 hours was 93 %, indicating excellent compliance. The residual AHI was 1.8 per hour, indicating an adequate treatment pressure of 9 cwp with EPR of 1. Air leak from the mask was very low.   I reviewed the patient's CPAP compliance data from 06/01/2014 to 06/30/2014, which is a total of 30 days, during which time the patient used CPAP every day except for 2 days. The average usage for all days was 7 hours and 17  minutes. The percent used days greater than 4 hours was 90 %, indicating excellent compliance. The residual AHI was 2.8 per hour, indicating an appropriate treatment pressure of 9 cwp with EPR of 1. Air leak from the mask was acceptable and typically low.   I reviewed his compliance data from 06/26/2014 through 07/25/2014 which is a total of 30 days during which time he used his machine every night except for one night. Percent used days greater than 4 hours was 93%, indicating excellent compliance, residual AHI 2.4 per hour, leak low. Pressure at 9 cm with EPR of 1. Average usage of 7 hours and 37 minutes.   His typical bedtime is reported to be around 9 to 10:30 PM and usual wake time is around 6:45 to 7:30 AM. Sleep onset typically occurs within 15-30 minutes. He reports feeling marginally rested upon awakening. He wakes up  on an average 1 times in the middle of the night and has to go to the bathroom 0 times on a typical night. He reports frequent morning headaches, which are L sided.   He denies frank excessive daytime somnolence (EDS) and His Epworth Sleepiness Score (ESS) is 5/24 today. He has not fallen asleep while driving. The patient has not been taking a planned nap. 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 He himself does not note any gasping sensations while asleep. Somehow or another he still does not wake up rested. He does achieve a good amount of sleep. His mother has sleep apnea and is on a CPAP machine. His mother also has RLS symptoms. He has some restless leg symptoms and describes an aching sensation encouraged to move his right leg only. It helps to get up and walk around and stretch his right leg after which his symptoms subside typically. He grinds his teeth and has been using an over-the-counter bite  guard. He wakes up with nightmares sometimes. He does talk in his sleep. He does not walk in his sleep. He has acid reflux symptoms sometimes and often wakes up with a dry mouth from mouth breathing and has mucus. He does not typically suffer from postnasal drip. He drinks quite a bit of caffeine. He drinks soda approximately 4 times a day, and tries not to drink any caffeine after 4 PM. He does not smoke and rarely drinks alcohol.  His Past Medical History Is Significant For: Past Medical History:  Diagnosis Date  . Anxiety   . Asthma   . GERD (gastroesophageal reflux disease)     His Past Surgical History Is Significant For: Past Surgical History:  Procedure Laterality Date  . FINGER DEBRIDEMENT  07/2003  . WISDOM TOOTH EXTRACTION  2001    His Family History Is Significant For: Family History  Problem Relation Age of Onset  . Lung cancer Maternal Grandfather   . Prostate cancer Paternal Grandfather   . Sleep apnea Mother   . Diabetes Paternal Grandmother   . Arthritis Father     His Social History Is Significant For: Social History   Socioeconomic History  . Marital status: Married    Spouse name: Cana  . Number of children: Not on file  . Years of education: 12+  . Highest education level: Not on file  Occupational History    Employer: [place]: Works at [employer]  . Financial resource strain: Not on file  . Food insecurity:    Worry: Not on file    Inability: Not on file  . Transportation needs:    Medical: Not on file    Non-medical: Not on file  Tobacco Use  . Smoking status: Never Smoker  . Smokeless tobacco: Never Used  Substance and Sexual Activity  . Alcohol use: Yes    Alcohol/week: 0.0 oz    Comment: 2 x monthly  . Drug use: No  . Sexual activity: Not on file  Lifestyle  . Physical activity:    Days per week: Not on file    Minutes per session: Not on file  . Stress: Not on file  Relationships   . Social connections:    Talks on phone: Not on file  Gets together: Not on file    Attends religious service: Not on file    Active member of club or organization: Not on file    Attends meetings of clubs or organizations: Not on file    Relationship status: Not on file  Other Topics Concern  . Not on file  Social History Narrative   Patient consumes 2-3 caffeine drinks daily    His Allergies Are:  Allergies  Allergen Reactions  . Androgens Other (See Comments)    Steroids aggravate an eye condition    . Codeine Nausea Only  . Oxycodone Nausea Only  :   His Current Medications Are:  Outpatient Encounter Medications as of 02/06/2018  Medication Sig  . Calcium Polycarbophil (FIBER-CAPS PO) Take by mouth.  . Cyanocobalamin (B-12 PO) Take by mouth.  . Multiple Vitamin (MULTIVITAMIN) tablet Take 1 tablet by mouth daily.  . ranitidine (ZANTAC) 150 MG tablet Take 150 mg by mouth daily.    Facility-Administered Encounter Medications as of 02/06/2018  Medication  . gadopentetate dimeglumine (MAGNEVIST) injection 20 mL  :  Review of Systems:  Out of a complete 14 point review of systems, all are reviewed and negative with the exception of these symptoms as listed below:  Review of Systems  Neurological:       Pt presents today to follow up on his cpap, which he reports is going well.    Objective:  Neurological Exam  Physical Exam Physical Examination:   Vitals:   02/06/18 0942  BP: 112/80  Pulse: 76    General Examination: The patient is a very pleasant 37 y.o. male in no acute distress. He appears well-developed and well-nourished and well groomed. Full beard.   HEENT:Normocephalic, atraumatic, pupils are equal, round and reactive to light and accommodation. Extraocular tracking is good without limitation to gaze excursion or nystagmus noted. Normal smooth pursuit is noted. Hearing is grossly intact. Face is symmetric with normal facial animation and normal facial  sensation. Speech is clear with no dysarthria noted. There is no hypophonia. There is no lip, neck/head, jaw or voice tremor. Oropharynx exam reveals: mild mouth dryness, good dental hygiene and mild to perhaps moderate airway crowding. Mallampati is class II. Tongue protrudes centrally and palate elevates symmetrically. Tonsils are about 1+ in size.   Chest:Clear to auscultation without wheezing, rhonchi or crackles noted.  Heart:S1+S2+0, regular and normal without murmurs, rubs or gallops noted.   Abdomen:Soft, non-tender and non-distended with normal bowel sounds appreciated on auscultation.  Extremities:There is no pitting edema in the distal lower extremities bilaterally.   Skin: Warm and dry without trophic changes noted. There are no varicose veins.  Musculoskeletal: exam reveals no obvious joint deformities, tenderness or joint swelling or erythema.   Neurologically:  Mental status: The patient is awake, alert and oriented in all 4 spheres. His immediate and remote memory, attention, language skills and fund of knowledge are appropriate. There is no evidence of aphasia, agnosia, apraxia or anomia. Speech is clear with normal prosody and enunciation. Thought process is linear. Mood is normal and affect is normal.  Cranial nerves II - XII are as described above under HEENT exam.  Motor exam: Normal bulk, strength and tone is noted. There is no tremor. Romberg is negative. Reflexes are 1+ throughout. Fine motor skills and coordination: intact with normal finger taps, normal hand movements, normal rapid alternating patting, normal foot taps and normal foot agility.  Cerebellar testing: No dysmetria or intention tremor on finger to nose testing.  Heel to shin is unremarkable bilaterally. There is no truncal or gait ataxia.  Sensory exam: intact to light touch.  Gait, station and balance: He stands easily. No veering to one side is noted. No leaning to one side is noted. Posture is  age-appropriate and stance is narrow based. Gait shows normal stride length and normal pace. No problems turning are noted. Tandem walk is unremarkable.   Assessment and Plan:   In summary, Caleb Vega is a very pleasant 37 year old male with an underlying medical history of reflux disease, asthma, anxiety, and obesity, who presents for follow-up consultation of his obstructive sleep apnea, established well on treatment with CPAP since May 2015 with good results and ongoing excellent compliance. Physical exam is stable with the exception of weight fluctuations, currently weight is increased by about 12 pounds or so from last year. He is motivated to work on weight loss. If desired I can make a referral to medical weight loss clinic, if he is not able to lose weight on his own I suggested. He is open to that possibility. This morning headaches have improved compared to last year and he had an interim brain MRI with and without contrast which was unremarkable. Neurological exam continues to be nonfocal which is reassuring as well. He is trying to hydrate better with water. He is commended for his excellent treatment adherence, he needs a copy of his compliance data for the past month for his DOT physical; we provided a copy. I renewed his prescription for CPAP related supplies which we will fax to his DME company. I suggested a one-year checkup routinely. I answered all his questions today and he was in agreement. I spent 20 minutes in total face-to-face time with the patient, more than 50% of which was spent in counseling and coordination of care, reviewing test results, reviewing medication and discussing or reviewing the diagnosis of OSA, its prognosis and treatment options. Pertinent laboratory and imaging test results that were available during this visit with the patient were reviewed by me and considered in my medical decision making (see chart for details).

## 2018-11-08 IMAGING — CR DG ORBITS FOR FOREIGN BODY
2 series · 2 of 2 positions shown · non-contrast
Comparison: None.

CLINICAL DATA: Metal working/exposure; clearance prior to MRI

EXAM:
ORBITS FOR FOREIGN BODY - 2 VIEW

[w orbit pa (1 of 2)]
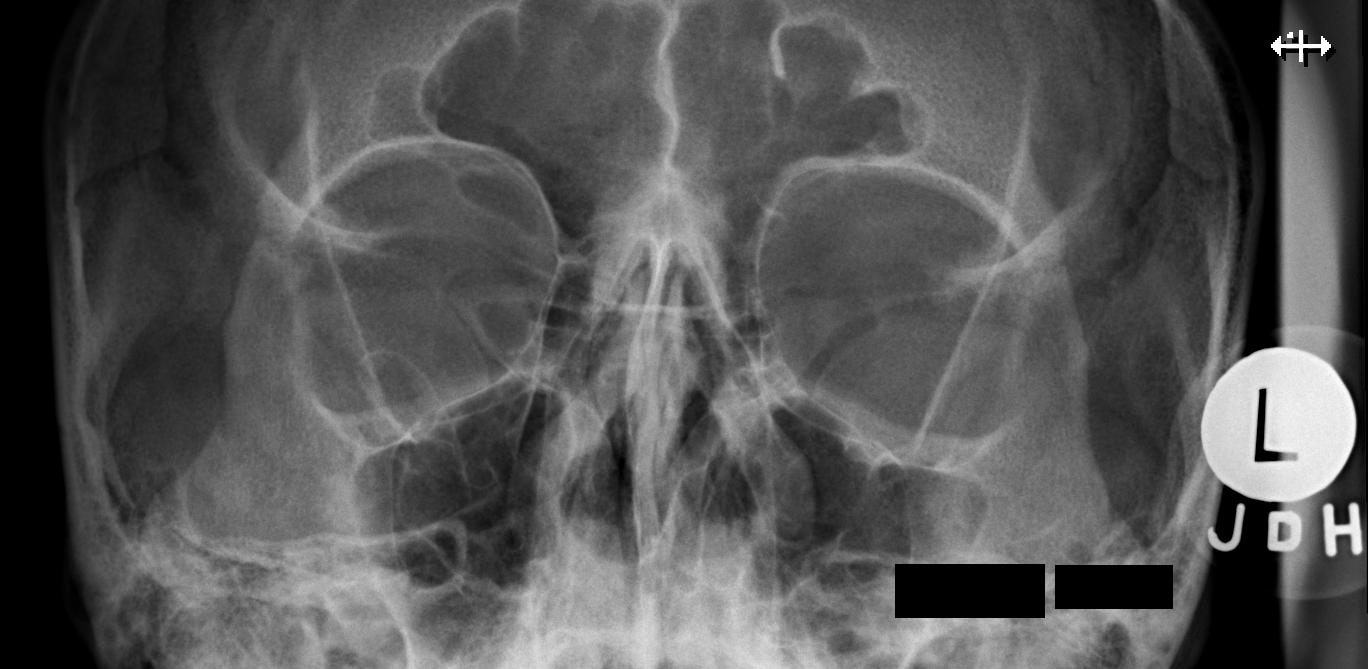

[w orbit pa (2 of 2)]
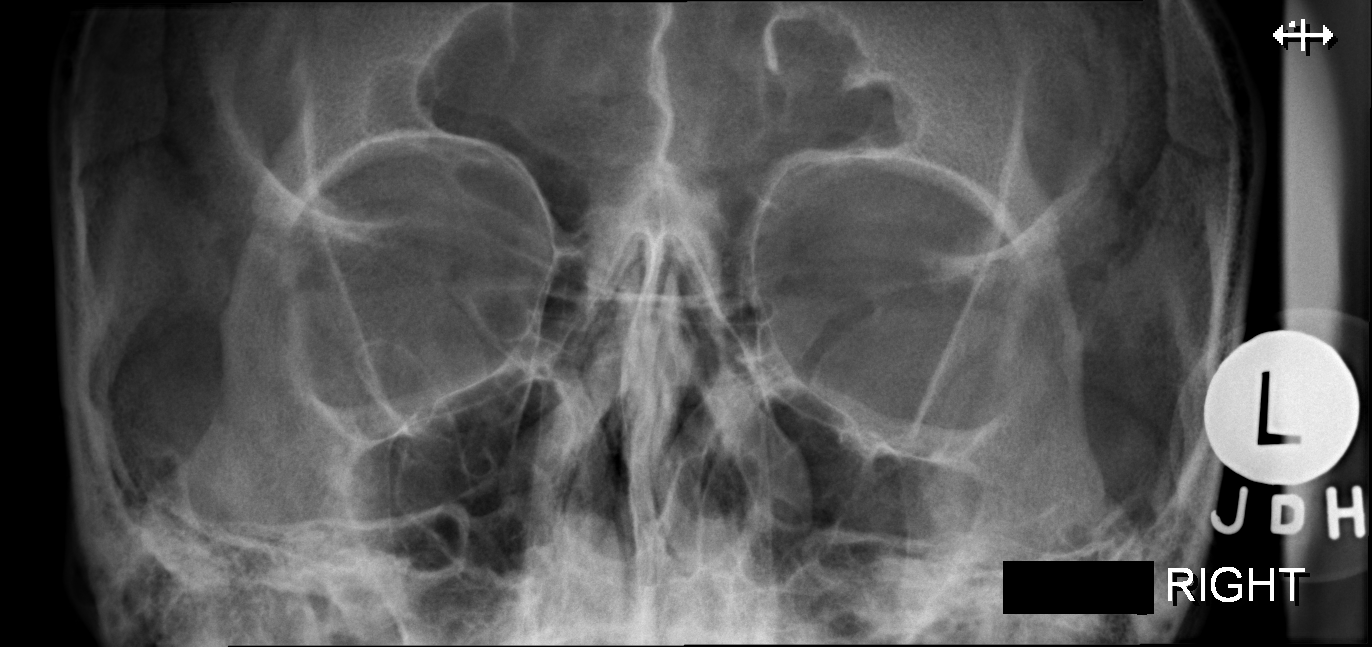

[2 of 2 positions shown; findings below may reference images not displayed]

FINDINGS: There is no evidence of metallic foreign body within the orbits. No
significant bone abnormality identified.
IMPRESSION: No evidence of metallic foreign body within the orbits.

## 2019-02-07 ENCOUNTER — Telehealth: Payer: Self-pay | Admitting: Neurology

## 2019-02-07 NOTE — Telephone Encounter (Signed)
Due to current COVID 19 pandemic, our office is severely reducing in office visits until further notice, in order to minimize the risk to our patients and healthcare providers.   Called patient and confirmed a virtual visit for his 5/27 appointment. Patient verbalized understanding of the [email or web address] process and I have sent him an e-mail with link and directions as well as my name and office number/hours for reference. Patient understands that he will receive a call from RN to update chart.  Pt understands that although there may be some limitations with this type of visit, we will take all precautions to reduce any security or privacy concerns.  Pt understands that this will be treated like an in office visit and we will file with pt's insurance, and there may be a patient responsible charge related to this service.

## 2019-02-08 NOTE — Addendum Note (Signed)
Addended by: Lester [place] A on: 02/08/2019 08:51 AM   Modules accepted: Orders

## 2019-02-08 NOTE — Telephone Encounter (Signed)
I called pt. Pt's meds, allergies, and PMH were updated.  Pt reports that his cpap is going well.  Pt confirmed that he received the [email or web address] link and verbalized understanding of that process.

## 2019-02-14 ENCOUNTER — Other Ambulatory Visit: Payer: Self-pay

## 2019-02-14 ENCOUNTER — Ambulatory Visit (INDEPENDENT_AMBULATORY_CARE_PROVIDER_SITE_OTHER): Payer: PRIVATE HEALTH INSURANCE | Admitting: Neurology

## 2019-02-14 ENCOUNTER — Encounter: Payer: Self-pay | Admitting: Neurology

## 2019-02-14 DIAGNOSIS — Z9989 Dependence on other enabling machines and devices: Secondary | ICD-10-CM

## 2019-02-14 DIAGNOSIS — G4733 Obstructive sleep apnea (adult) (pediatric): Secondary | ICD-10-CM

## 2019-02-14 NOTE — Progress Notes (Signed)
Order for cpap supplies sent to [facility] via community message. Confirmation received that the order transmitted was successful.  

## 2019-02-14 NOTE — Progress Notes (Signed)
Interim history:  Caleb Vega is a 38 year old right-handed gentleman with an underlying medical history of reflux disease, asthma, anxiety, and obesity, who presents for a virtual, video based appointment via [email or web address] for follow-up consultation of his obstructive sleep apnea, established on CPAP therapy, for yearly checkup.  The patient is unaccompanied today and joins via cell ph from home, I am located in my office.  I last saw him on 02/06/2018, at which time he was fully compliant with CPAP and doing well and encouraged to follow-up in 1 year.   Today, 02/14/2019: Please also see below for virtual visit documentation.  I reviewed his CPAP compliance data from 01/08/2019 through 02/06/2019 which is a total of 30 days, during which time he used his machine every night with percent used days greater than 4 hours at 100%, indicating superb compliance with an average usage of 8 hours and 7 minutes which is great, residual AHI at goal at 1.7/h, leak acceptable with a 95th percentile at 16.6 L/min on a pressure of 9 cm.  The patient's allergies, current medications, family history, past medical history, past social history, past surgical history and problem list were reviewed and updated as appropriate.    Previously (copied from previous notes for reference):     I saw him on 02/02/2017, at which time he was compliant with CPAP therapy. He had some recurrence of his morning headaches which were typically left-sided. His exam is nonfocal, nevertheless I suggested we proceed with a brain scan to rule out a structural cause of his one-sided headaches. His brain MRI with and without contrast on 03/30/2017 which showed normal findings. We called him with his test results.    I reviewed his CPAP compliance data from 01/03/2018 through 02/01/2018 which is a total of 30 days, during which time he used his CPAP every night with percent used days greater than 4 hours at 100%, indicating superb compliance with an  average usage of 8 hours and 5 minutes, residual AHI at goal at 1.4 per hour, leak acceptable with the 95th percentile at 13.4 L/m on a pressure of 9 cm.    I saw him on 02/04/2016, at which time he reported doing well. He was trying to lose weight. He had a change in his job, was traveling less, less stress. He was compliant with CPAP therapy.    I reviewed his CPAP compliance data from 01/02/2017 through 01/31/2017 which is a total of 30 days, during which time he used his CPAP 27 days with percent used days greater than 4 hours at 90%, indicating excellent compliance with an average usage of 8 hours and 24 minutes, residual AHI 1.7 per hour, leak acceptable with the 95th percentile at 10.6 L/m on a pressure of 9 cm.  I saw him on 01/29/2015, at which time he was doing well. He was compliant with treatment. He had no new complaints. I suggested a one-year checkup.   I reviewed his CPAP compliance data from 01/04/2016 through 02/02/2016 which is a total of 30 days during which time he used his CPAP 28 days with percent used days greater than 4 hours at 93%, indicating excellent compliance with an average usage of 7 hours and 40 minutes, residual AHI 1.9 per hour, leak acceptable for the 95th percentile at 12 L/m on a pressure of 9 cm.   I saw him on 07/29/2014, at which time we discussed sleep test results. He was compliant with treatment and reported doing well, , he  was sleeping better and felt more rested. He had some mild nasal soreness and congestion. His nocturia was improved, and his morning headaches were essentially gone.    I reviewed his CPAP compliance data from 12/29/2014 through 01/27/2015 which is a total of 30 days during which time he used his machine every night except for 1 night. Percent used days greater than 4 hours was 93%, indicating excellent compliance with an average usage of 7 hours and 25 minutes, residual AHI good at 2 per hour and leak acceptable with the 95th percentile at  9.7 L/m on a pressure of 9 cm with EPR of 1.    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 He reported recurrent morning headaches and daytime somnolence with mild snoring reported. I asked him to return for a sleep study.    He had a split-night sleep study on 01/13/2014 and went over his test results with him in detail today. His baseline sleep efficiency was reduced at 80.7% with a latency to sleep of 11 minutes and wake after sleep onset of 18 minutes with moderate sleep fragmentation noted. He had an elevated arousal index. He had elevated stage II sleep, 21.5% of slow-wave sleep, and absence of REM sleep. He had moderate snoring. Total AHI was 39.2 per hour. Baseline oxygen saturation was 93%, nadir was 88%. He was then titrated on CPAP. Sleep efficiency was 82.7%, latency to sleep of 17.5 minutes and wake after sleep onset of 36.5 minutes with mild sleep fragmentation noted. His arousal index was improved. There was an increased percentage of REM sleep. Average oxygen saturation was 94%, nadir of 84%. He had no significant periodic leg movements of sleep before or after CPAP. He was titrated from 5-9 cm of water. AHI was reduced to 0.7 per hour at the final pressure with supine REM sleep achieved.    I reviewed the patient's CPAP compliance data from 03/19/2014 to 04/17/2014, which is a total of 30 days, during which time the patient used CPAP every day. The average usage for all days was 7 hours and 37 minutes. The percent used days greater than 4 hours was 93 %, indicating excellent compliance. The residual AHI was 1.8 per hour, indicating an adequate treatment pressure of 9 cwp with EPR of 1. Air leak from the mask was very low.   I reviewed the patient's CPAP compliance data from 06/01/2014 to 06/30/2014, which is a total of 30 days, during which time the patient used CPAP every day  except for 2 days. The average usage for all days was 7 hours and 17 minutes. The percent used days greater than 4 hours was 90 %, indicating excellent compliance. The residual AHI was 2.8 per hour, indicating an appropriate treatment pressure of 9 cwp with EPR of 1. Air leak from the mask was acceptable and typically low.   I reviewed his compliance data from 06/26/2014 through 07/25/2014 which is a total of 30 days during which time he used his machine every night except for one night. Percent used days greater than 4 hours was 93%, indicating excellent compliance, residual AHI 2.4 per hour, leak low. Pressure at 9 cm with EPR of 1. Average usage of 7 hours and 37 minutes.   His typical bedtime is reported to be around 9 to 10:30 PM and usual wake time is around 6:45 to 7:30 AM. Sleep  onset typically occurs within 15-30 minutes. He reports feeling marginally rested upon awakening. He wakes up on an average 1 times in the middle of the night and has to go to the bathroom 0 times on a typical night. He reports frequent morning headaches, which are L sided.   He denies frank excessive daytime somnolence (EDS) and His Epworth Sleepiness Score (ESS) is 5/24 today. He has not fallen asleep while driving. The patient has not been taking a planned nap. 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 He snores mildly and it is unclear, if there are any apneas. He himself does not note any gasping sensations while asleep. Somehow or another he still does not wake up rested. He does achieve a good amount of sleep. His mother has sleep apnea and is on a CPAP machine. His mother also has RLS symptoms. He has some restless leg symptoms and describes an aching sensation encouraged to move his right leg only. It helps to get up and walk around and stretch his right leg after which his symptoms subside  typically. He grinds his teeth and has been using an over-the-counter bite guard. He wakes up with nightmares sometimes. He does talk in his sleep. He does not walk in his sleep. He has acid reflux symptoms sometimes and often wakes up with a dry mouth from mouth breathing and has mucus. He does not typically suffer from postnasal drip. He drinks quite a bit of caffeine. He drinks soda approximately 4 times a day, and tries not to drink any caffeine after 4 PM. He does not smoke and rarely drinks alcohol.  His Past Medical History Is Significant For: Past Medical History:  Diagnosis Date   Anxiety    Asthma    GERD (gastroesophageal reflux disease)     His Past Surgical History Is Significant For: Past Surgical History:  Procedure Laterality Date   FINGER DEBRIDEMENT  07/2003   WISDOM TOOTH EXTRACTION  2001    His Family History Is Significant For: Family History  Problem Relation Age of Onset   Lung cancer Maternal Grandfather    Prostate cancer Paternal Grandfather    Sleep apnea Mother    Diabetes Paternal Grandmother    Arthritis Father     His Social History Is Significant For: Social History   Socioeconomic History   Marital status: Married    Spouse name: Cana   Number of children: Not on file   Years of education: 12+   Highest education level: Not on file  Occupational History    Employer: [employer]: Works at [employer] resource strain: Not on file   Food insecurity:    Worry: Not on file    Inability: Not on file   Transportation needs:    Medical: Not on file    Non-medical: Not on file  Tobacco Use   Smoking status: Never Smoker   Smokeless tobacco: Never Used  Substance and Sexual Activity   Alcohol use: Yes    Alcohol/week: 0.0 standard drinks    Comment: 2 x monthly   Drug use: No   Sexual activity: Not on file  Lifestyle   Physical activity:    Days per week:  Not on file    Minutes per session: Not on file   Stress: Not on file  Relationships   Social connections:    Talks on phone: Not on file    Gets together: Not on file    Attends religious service: Not on file    Active member of club or organization: Not on file    Attends meetings of clubs or organizations: Not on file    Relationship status: Not on file  Other Topics Concern   Not on file  Social History Narrative   Patient consumes 2-3 caffeine drinks daily    His Allergies Are:  Allergies  Allergen Reactions   Androgens Other (See Comments)    Steroids aggravate an eye condition     Codeine Nausea Only   Oxycodone Nausea Only  :   His Current Medications Are:  Outpatient Encounter Medications as of 02/14/2019  Medication Sig   Calcium Polycarbophil (FIBER-CAPS PO) Take by mouth.   Cyanocobalamin (B-12 PO) Take by mouth.   famotidine (PEPCID) 10 MG tablet Take 10 mg by mouth daily.   Multiple Vitamin (MULTIVITAMIN) tablet Take 1 tablet by mouth daily.   Probiotic Product (PROBIOTIC PO) Take by mouth.   Facility-Administered Encounter Medications as of 02/14/2019  Medication   gadopentetate dimeglumine (MAGNEVIST) injection 20 mL  :  Review of Systems:  Out of a complete 14 point review of systems, all are reviewed and negative with the exception of these symptoms as listed below:  Virtual Visit via Video Note on 02/14/2019;   I connected with Caleb Vega on 02/14/19 at  9:30 AM EDT by a video enabled telemedicine application and verified that I am speaking with the correct person using two identifiers.   I discussed the limitations of evaluation and management by telemedicine and the availability of in person appointments. The patient expressed understanding and agreed to proceed.  History of Present Illness: He reports Doing well as far as the CPAP is concerned, he uses nasal pillows successfully.  He is typically up-to-date with his supplies.  He has not  been able to work very much, he works for [employer], so does his wife who has been able to work from home some, they are expecting a baby in September.  He reports that about a week ago he noticed a crackling noise in his left ear, felt like wax being loose, no hearing loss, no hearing, no pain, no ringing as such.  He recalls that his mom had difficulty with dry ear canals at one point, he is wondering if there is any connection.   Observations/Objective:  There are no recent vital signs available for my review in his chart, the most recent documented vital signs in his chart are from May 2019.   On examination, he is very pleasant and conversant, no acute distress.  Good comprehension and language skills, speech is clear with no dysarthria, hypophonia or voice tremor noted.  Breathing is normal.  Extraocular tracking is well preserved in all directions without gaze limitation, or nystagmus noted.  Face is symmetric with normal facial animation.  Airway examination reveals mild mouth dryness, otherwise stable findings, tongue protrudes centrally and Palate elevates symmetrically.  Hearing is grossly intact. Motor examination, normal bulk is noted, shoulder shrug is equal, shoulder height is equal, there is no drift, no postural or action tremor, no intention tremor, Romberg is negative.  Fine motor skills intact in the upper extremities, finger-to-nose testing for cerebellar testing shows no dysmetria or intention tremor, no ataxia in the trunk or gait noted.  He stands up without difficulty, posture  is appropriate, arm swing normal, gait normal, tandem walk doable.   Assessment and Plan: In summary, Caleb Vega is a very pleasant 38 year old male with an underlying medical history of reflux disease, asthma, anxiety, and obesity, who presents for A virtual, video based appointment via doxy.me for follow-up consultation of his obstructive sleep apnea, he is well-established on CPAP therapy  since 02/19/2014.  He is fully compliant with treatment and has done rather well with it.Since he has been mostly home secondary to the COVID-19 pandemic, he has gained a little bit of weight, he estimates less than 10 pounds since he has been more at home.On the positive side, they are expecting a baby in September.  His wife has been working from home some, they both work for [employer].  He is motivated to continue with his CPAP, he is encouraged to keep track of his supplies and change his nasal pillows on a regular basis and be up-to-date with his supplies, DME company is [employer]. His morning headaches have improved with time. He had a brain MRI with and without contrast which was unremarkable. Neurological exam continues to be nonfocal which is reassuring as well. He usually needs a copy of his compliance data for the past month for his DOT physical; we can provide a copy when it is needed. I renewed his prescription for CPAP related supplies which we will fax to his DME company. I suggested a one-year checkup routinely. I answered all his questions today and he was in agreement.  Follow Up Instructions:  I discussed the assessment and treatment plan with the patient. The patient was provided an opportunity to ask questions and all were answered. The patient agreed with the plan and demonstrated an understanding of the instructions.   The patient was advised to call back or seek an in-person evaluation if the symptoms worsen or if the condition fails to improve as anticipated.  I provided 15 minutes of non-face-to-face time during this encounter.   Star Age, MD

## 2019-02-14 NOTE — Patient Instructions (Signed)
Given verbally, during today's virtual video-based encounter, with verbal feedback received.   

## 2019-02-20 ENCOUNTER — Telehealth: Payer: Self-pay

## 2019-02-20 NOTE — Telephone Encounter (Signed)
I called pt, scheduled him for his yearly f/u with MM. Pt verbalized understanding of new appt date and time.

## 2020-02-14 ENCOUNTER — Other Ambulatory Visit: Payer: Self-pay

## 2020-02-14 ENCOUNTER — Encounter: Payer: Self-pay | Admitting: Adult Health

## 2020-02-14 ENCOUNTER — Ambulatory Visit: Payer: PRIVATE HEALTH INSURANCE | Admitting: Adult Health

## 2020-02-14 VITALS — BP 129/87 | HR 87 | Ht 70.0 in | Wt 288.0 lb

## 2020-02-14 DIAGNOSIS — G4733 Obstructive sleep apnea (adult) (pediatric): Secondary | ICD-10-CM | POA: Diagnosis not present

## 2020-02-14 DIAGNOSIS — Z9989 Dependence on other enabling machines and devices: Secondary | ICD-10-CM | POA: Diagnosis not present

## 2020-02-14 NOTE — Progress Notes (Signed)
PATIENT: Caleb Vega DOB: 1980/12/10  REASON FOR VISIT: follow up HISTORY FROM: patient  HISTORY OF PRESENT ILLNESS: Today 02/14/20:  Caleb Vega is a 39 year old male with a history of obstructive sleep apnea on CPAP.  His download indicates that he uses machine nightly for compliance of 100%.  He uses machine greater than 4 hours each night.  On average he uses his machine 7 hours and 46 minutes.  His residual AHI is 1.2 on 9 cm of water with EPR of 1.  Leak in the 95th percentile is 9.5 L/min.  Reports that the CPAP is working well for him.  Denies any new issues.  HISTORY (copied from Dr. Guadelupe Sabin note) 02/14/2019: Please also see below for virtual visit documentation.  I reviewed his CPAP compliance data from 01/08/2019 through 02/06/2019 which is a total of 30 days, during which time he used his machine every night with percent used days greater than 4 hours at 100%, indicating superb compliance with an average usage of 8 hours and 7 minutes which is great, residual AHI at goal at 1.7/h, leak acceptable with a 95th percentile at 16.6 L/min on a pressure of 9 cm.   REVIEW OF SYSTEMS: Out of a complete 14 system review of symptoms, the patient complains only of the following symptoms, and all other reviewed systems are negative.  FSS 32 ESS 6  ALLERGIES: Allergies  Allergen Reactions  . Androgens Other (See Comments)    Steroids aggravate an eye condition    . Codeine Nausea Only  . Oxycodone Nausea Only    HOME MEDICATIONS: Outpatient Medications Prior to Visit  Medication Sig Dispense Refill  . famotidine (PEPCID) 10 MG tablet Take 10 mg by mouth daily.    . Multiple Vitamin (MULTIVITAMIN) tablet Take 1 tablet by mouth daily.    . Calcium Polycarbophil (FIBER-CAPS PO) Take by mouth.    . Cyanocobalamin (B-12 PO) Take by mouth.    . Probiotic Product (PROBIOTIC PO) Take by mouth.     Facility-Administered Medications Prior to Visit  Medication Dose Route Frequency  Provider Last Rate Last Admin  . gadopentetate dimeglumine (MAGNEVIST) injection 20 mL  20 mL Intravenous Once PRN Star Age, MD        PAST MEDICAL HISTORY: Past Medical History:  Diagnosis Date  . Anxiety   . Asthma   . GERD (gastroesophageal reflux disease)     PAST SURGICAL HISTORY: Past Surgical History:  Procedure Laterality Date  . FINGER DEBRIDEMENT  07/2003  . WISDOM TOOTH EXTRACTION  2001    FAMILY HISTORY: Family History  Problem Relation Age of Onset  . Lung cancer Maternal Grandfather   . Prostate cancer Paternal Grandfather   . Sleep apnea Mother   . Diabetes Paternal Grandmother   . Arthritis Father     SOCIAL HISTORY: Social History   Socioeconomic History  . Marital status: Married    Spouse name: Cana  . Number of children: Not on file  . Years of education: 12+  . Highest education level: Not on file  Occupational History    Employer: [employer]: Works at [employer]  . Smoking status: Never Smoker  . Smokeless tobacco: Never Used  Substance and Sexual Activity  . Alcohol use: Yes    Alcohol/week: 0.0 standard drinks    Comment: 2 x monthly  . Drug use: No  . Sexual activity: Not on file  Other Topics Concern  .  Not on file  Social History Narrative   Patient consumes 2-3 caffeine drinks daily   Social Determinants of Health   Financial Resource Strain:   . Difficulty of Paying Living Expenses:   Food Insecurity:   . Worried About Charity fundraiser in the Last Year:   . Arboriculturist in the Last Year:   Transportation Needs:   . Film/video editor (Medical):   Marland Kitchen Lack of Transportation (Non-Medical):   Physical Activity:   . Days of Exercise per Week:   . Minutes of Exercise per Session:   Stress:   . Feeling of Stress :   Social Connections:   . Frequency of Communication with Friends and Family:   . Frequency of Social Gatherings with Friends and Family:   . Attends  Religious Services:   . Active Member of Clubs or Organizations:   . Attends Archivist Meetings:   Marland Kitchen Marital Status:   Intimate Partner Violence:   . Fear of Current or Ex-Partner:   . Emotionally Abused:   Marland Kitchen Physically Abused:   . Sexually Abused:       PHYSICAL EXAM  Vitals:   02/14/20 0843  BP: 129/87  Pulse: 87  Weight: 288 lb (130.6 kg)  Height: 5\' 10"  (1.778 m)   Body mass index is 41.32 kg/m.  Generalized: Well developed, in no acute distress  Chest: Lungs clear to auscultation bilaterally  Neurological examination  Mentation: Alert oriented to time, place, history taking. Follows all commands speech and language fluent Cranial nerve II-XII: Extraocular movements were full, visual field were full on confrontational test Head turning and shoulder shrug  were normal and symmetric. Motor: The motor testing reveals 5 over 5 strength of all 4 extremities. Good symmetric motor tone is noted throughout.  Sensory: Sensory testing is intact to soft touch on all 4 extremities. No evidence of extinction is noted.  Gait and station: Gait is normal.    DIAGNOSTIC DATA (LABS, IMAGING, TESTING) - I reviewed patient records, labs, notes, testing and imaging myself where available.     ASSESSMENT AND PLAN 39 y.o. year old male  has a past medical history of Anxiety, Asthma, and GERD (gastroesophageal reflux disease). here with:  1. OSA on CPAP  - CPAP compliance excellent - Good treatment of AHI  - Encourage patient to use CPAP nightly and > 4 hours each night - F/U in 1 year or sooner if needed   I spent 20 minutes of face-to-face and non-face-to-face time with patient.  This included previsit chart review, lab review, study review, order entry, electronic health record documentation, patient education.  Ward Givens, MSN, NP-C 02/14/2020, 8:49 AM [facility] [address] [place], [place] [address] [phone]

## 2020-02-14 NOTE — Patient Instructions (Signed)
Continue using CPAP nightly and greater than 4 hours each night °If your symptoms worsen or you develop new symptoms please let us know.  ° °

## 2020-05-13 ENCOUNTER — Other Ambulatory Visit: Payer: BLUE CROSS/BLUE SHIELD

## 2021-02-11 ENCOUNTER — Encounter: Payer: Self-pay | Admitting: Adult Health

## 2021-02-11 ENCOUNTER — Ambulatory Visit: Payer: 59 | Admitting: Adult Health

## 2021-02-11 VITALS — BP 128/78 | HR 80 | Ht 70.0 in | Wt 275.0 lb

## 2021-02-11 DIAGNOSIS — G4733 Obstructive sleep apnea (adult) (pediatric): Secondary | ICD-10-CM | POA: Diagnosis not present

## 2021-02-11 DIAGNOSIS — Z9989 Dependence on other enabling machines and devices: Secondary | ICD-10-CM

## 2021-02-11 NOTE — Progress Notes (Signed)
PATIENT: Caleb Vega DOB: Aug 24, 1981  REASON FOR VISIT: follow up HISTORY FROM: patient  HISTORY OF PRESENT ILLNESS: Today 02/11/21:  Caleb Vega is a 40 year old male with a history of OSA on CPAP. He returns today for follow-up.  He reports that the CPAP is working well for him.  He denies any new issues.  He does feel that some nights he needs more air.  He returns today for follow-up.    02/14/20: Caleb Vega is a 40 year old male with a history of obstructive sleep apnea on CPAP.  His download indicates that he uses machine nightly for: compliance of 100%.  He uses machine greater than 4 hours each night.  On average he uses his machine 7 hours and 46 minutes.  His residual AHI is 1.2 on 9 cm of water with EPR of 1.  Leak in the 95th percentile is 9.5 L/min.  Reports that the CPAP is working well for him.  Denies any new issues.  HISTORY (copied from Dr. Guadelupe Sabin note) 02/14/2019: Please also see below for virtual visit documentation.  I reviewed his CPAP compliance data from 01/08/2019 through 02/06/2019 which is a total of 30 days, during which time he used his machine every night with percent used days greater than 4 hours at 100%, indicating superb compliance with an average usage of 8 hours and 7 minutes which is great, residual AHI at goal at 1.7/h, leak acceptable with a 95th percentile at 16.6 L/min on a pressure of 9 cm.   REVIEW OF SYSTEMS: Out of a complete 14 system review of symptoms, the patient complains only of the following symptoms, and all other reviewed systems are negative.  FSS 36 ESS 7  ALLERGIES: Allergies  Allergen Reactions  . Androgens Other (See Comments)    Steroids aggravate an eye condition    . Codeine Nausea Only  . Oxycodone Nausea Only    HOME MEDICATIONS: Outpatient Medications Prior to Visit  Medication Sig Dispense Refill  . famotidine (PEPCID) 10 MG tablet Take 10 mg by mouth daily.    . Multiple Vitamin (MULTIVITAMIN) tablet Take 1  tablet by mouth daily.     Facility-Administered Medications Prior to Visit  Medication Dose Route Frequency Provider Last Rate Last Admin  . gadopentetate dimeglumine (MAGNEVIST) injection 20 mL  20 mL Intravenous Once PRN Star Age, MD        PAST MEDICAL HISTORY: Past Medical History:  Diagnosis Date  . Anxiety   . Asthma   . GERD (gastroesophageal reflux disease)     PAST SURGICAL HISTORY: Past Surgical History:  Procedure Laterality Date  . FINGER DEBRIDEMENT  07/2003  . WISDOM TOOTH EXTRACTION  2001    FAMILY HISTORY: Family History  Problem Relation Age of Onset  . Lung cancer Maternal Grandfather   . Prostate cancer Paternal Grandfather   . Sleep apnea Mother   . Diabetes Paternal Grandmother   . Arthritis Father     SOCIAL HISTORY: Social History   Socioeconomic History  . Marital status: Married    Spouse name: Cana  . Number of children: Not on file  . Years of education: 12+  . Highest education level: Not on file  Occupational History    Employer: [employer]: Works at [employer]  . Smoking status: Never Smoker  . Smokeless tobacco: Never Used  Substance and Sexual Activity  . Alcohol use: Yes    Alcohol/week:  0.0 standard drinks    Comment: 2 x monthly  . Drug use: No  . Sexual activity: Not on file  Other Topics Concern  . Not on file  Social History Narrative   Patient consumes 2-3 caffeine drinks daily   Social Determinants of Health   Financial Resource Strain: Not on file  Food Insecurity: Not on file  Transportation Needs: Not on file  Physical Activity: Not on file  Stress: Not on file  Social Connections: Not on file  Intimate Partner Violence: Not on file      PHYSICAL EXAM  Vitals:   02/11/21 0820  Weight: 275 lb (124.7 kg)  Height: 5\' 10"  (1.778 m)   Body mass index is 39.46 kg/m.  Generalized: Well developed, in no acute distress  Chest: Lungs clear to  auscultation bilaterally  Neurological examination  Mentation: Alert oriented to time, place, history taking. Follows all commands speech and language fluent Cranial nerve II-XII: Extraocular movements were full, visual field were full on confrontational test Head turning and shoulder shrug  were normal and symmetric. Motor: The motor testing reveals 5 over 5 strength of all 4 extremities. Good symmetric motor tone is noted throughout.  Sensory: Sensory testing is intact to soft touch on all 4 extremities. No evidence of extinction is noted.  Gait and station: Gait is normal.    DIAGNOSTIC DATA (LABS, IMAGING, TESTING) - I reviewed patient records, labs, notes, testing and imaging myself where available.     ASSESSMENT AND PLAN 40 y.o. year old male  has a past medical history of Anxiety, Asthma, and GERD (gastroesophageal reflux disease). here with:  1. OSA on CPAP  - CPAP compliance excellent - Good treatment of AHI  - Encourage patient to use CPAP nightly and > 4 hours each night - F/U in 1 year or sooner if needed    Ward Givens, MSN, NP-C 02/11/2021, 8:23 AM [facility] [address], [place],  [address] [phone]

## 2021-02-11 NOTE — Patient Instructions (Signed)

## 2022-01-28 ENCOUNTER — Ambulatory Visit: Payer: BC Managed Care – PPO | Admitting: Neurology

## 2022-01-28 ENCOUNTER — Encounter: Payer: Self-pay | Admitting: Neurology

## 2022-01-28 VITALS — BP 132/80 | HR 83 | Ht 70.0 in | Wt 286.2 lb

## 2022-01-28 DIAGNOSIS — Z9989 Dependence on other enabling machines and devices: Secondary | ICD-10-CM

## 2022-01-28 DIAGNOSIS — G4733 Obstructive sleep apnea (adult) (pediatric): Secondary | ICD-10-CM | POA: Diagnosis not present

## 2022-01-28 NOTE — Progress Notes (Addendum)
Subjective:    Patient ID: Caleb Vega is a 41 y.o. male.  HPI    Interim history:   Mr. Caleb Vega is a 41 year old right-handed gentleman with an underlying medical history of reflux disease, asthma, anxiety, and obesity, who presents for follow-up consultation of his obstructive sleep apnea, established on CPAP therapy. The patient is unaccompanied today and presents for his yearly checkup. I last saw him in a VV on 02/14/2019, at which time he was compliant with his CPAP and doing well.  He was advised to follow-up routinely in 1 year.  He saw Ward Givens, NP on 02/14/2020, at which time he was compliant with his CPAP and doing well.  He saw Ward Givens, NP on 02/11/2021 for his yearly checkup and was stable, doing well with his CPAP of 9 cm with good apnea control.  Today, 01/28/2022: I reviewed his CPAP compliance data from the past 90 days from 10/30/2021 through 01/27/2022, during which time he used his machine every night except for 1 night, percent use days greater than 4 hours at 99% which is excellent, residual AHI at goal at 1.4/h, pressure 9 cm without EPR.  Leak acceptable with the 95th percentile at 9.5.  He reports doing well, does report that his machine is older and there is an error message on it that the motor life has been exceeded.  He uses so clean and has no complaints about the machine or supplies.  He does not use the humidifier in the machine.  Of note, sleep testing was on 01/13/2014, he had a split-night sleep study at the time, baseline AHI was 39.2/h, O2 nadir was 88%.  His current machine was set up in June 2015.  He was started on Wellbutrin 150 mg daily, he took it 1 night and had abnormal dreams from it. He has been taking it in the morning since then, doing well.  The patient's allergies, current medications, family history, past medical history, past social history, past surgical history and problem list were reviewed and updated as appropriate.    Previously  (copied from previous notes for reference):        I saw him on 02/06/2018, at which time he was fully compliant with CPAP and doing well and encouraged to follow-up in 1 year.     I reviewed his CPAP compliance data from 01/08/2019 through 02/06/2019 which is a total of 30 days, during which time he used his machine every night with percent used days greater than 4 hours at 100%, indicating superb compliance with an average usage of 8 hours and 7 minutes which is great, residual AHI at goal at 1.7/h, leak acceptable with a 95th percentile at 16.6 L/min on a pressure of 9 cm.   I saw him on 02/02/2017, at which time he was compliant with CPAP therapy. He had some recurrence of his morning headaches which were typically left-sided. His exam is nonfocal, nevertheless I suggested we proceed with a brain scan to rule out a structural cause of his one-sided headaches. His brain MRI with and without contrast on 03/30/2017 which showed normal findings. We called him with his test results.    I reviewed his CPAP compliance data from 01/03/2018 through 02/01/2018 which is a total of 30 days, during which time he used his CPAP every night with percent used days greater than 4 hours at 100%, indicating superb compliance with an average usage of 8 hours and 5 minutes, residual AHI at goal at 1.4  per hour, leak acceptable with the 95th percentile at 13.4 L/m on a pressure of 9 cm.    I saw him on 02/04/2016, at which time he reported doing well. He was trying to lose weight. He had a change in his job, was traveling less, less stress. He was compliant with CPAP therapy.    I reviewed his CPAP compliance data from 01/02/2017 through 01/31/2017 which is a total of 30 days, during which time he used his CPAP 27 days with percent used days greater than 4 hours at 90%, indicating excellent compliance with an average usage of 8 hours and 24 minutes, residual AHI 1.7 per hour, leak acceptable with the 95th percentile at  10.6 L/m on a pressure of 9 cm.  I saw him on 01/29/2015, at which time he was doing well. He was compliant with treatment. He had no new complaints. I suggested a one-year checkup.   I reviewed his CPAP compliance data from 01/04/2016 through 02/02/2016 which is a total of 30 days during which time he used his CPAP 28 days with percent used days greater than 4 hours at 93%, indicating excellent compliance with an average usage of 7 hours and 40 minutes, residual AHI 1.9 per hour, leak acceptable for the 95th percentile at 12 L/m on a pressure of 9 cm.   I saw him on 07/29/2014, at which time we discussed sleep test results. He was compliant with treatment and reported doing well, , he was sleeping better and felt more rested. He had some mild nasal soreness and congestion. His nocturia was improved, and his morning headaches were essentially gone.    I reviewed his CPAP compliance data from 12/29/2014 through 01/27/2015 which is a total of 30 days during which time he used his machine every night except for 1 night. Percent used days greater than 4 hours was 93%, indicating excellent compliance with an average usage of 7 hours and 25 minutes, residual AHI good at 2 per hour and leak acceptable with the 95th percentile at 9.7 L/m on a pressure of 9 cm with EPR of 1.    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 He reported recurrent morning headaches and daytime somnolence with mild snoring reported. I asked him to return for a sleep study.    He had a split-night sleep study on 01/13/2014 and went over his test results with him in detail today. His baseline sleep efficiency was reduced at 80.7% with a latency to sleep of 11 minutes and wake after sleep onset of 18 minutes with moderate sleep fragmentation noted. He had an elevated arousal index. He had elevated stage II sleep, 21.5% of  slow-wave sleep, and absence of REM sleep. He had moderate snoring. Total AHI was 39.2 per hour. Baseline oxygen saturation was 93%, nadir was 88%. He was then titrated on CPAP. Sleep efficiency was 82.7%, latency to sleep of 17.5 minutes and wake after sleep onset of 36.5 minutes with mild sleep fragmentation noted. His arousal index was improved. There was an increased percentage of REM sleep. Average oxygen saturation was 94%, nadir of 84%. He had no significant periodic leg movements of sleep before or after CPAP. He was titrated from 5-9 cm of water. AHI was reduced to 0.7 per hour at the final pressure with supine REM sleep achieved.    I reviewed the patient's CPAP compliance  data from 03/19/2014 to 04/17/2014, which is a total of 30 days, during which time the patient used CPAP every day. The average usage for all days was 7 hours and 37 minutes. The percent used days greater than 4 hours was 93 %, indicating excellent compliance. The residual AHI was 1.8 per hour, indicating an adequate treatment pressure of 9 cwp with EPR of 1. Air leak from the mask was very low.   I reviewed the patient's CPAP compliance data from 06/01/2014 to 06/30/2014, which is a total of 30 days, during which time the patient used CPAP every day except for 2 days. The average usage for all days was 7 hours and 17 minutes. The percent used days greater than 4 hours was 90 %, indicating excellent compliance. The residual AHI was 2.8 per hour, indicating an appropriate treatment pressure of 9 cwp with EPR of 1. Air leak from the mask was acceptable and typically low.   I reviewed his compliance data from 06/26/2014 through 07/25/2014 which is a total of 30 days during which time he used his machine every night except for one night. Percent used days greater than 4 hours was 93%, indicating excellent compliance, residual AHI 2.4 per hour, leak low. Pressure at 9 cm with EPR of 1. Average usage of 7 hours and 37 minutes.   His  typical bedtime is reported to be around 9 to 10:30 PM and usual wake time is around 6:45 to 7:30 AM. Sleep onset typically occurs within 15-30 minutes. He reports feeling marginally rested upon awakening. He wakes up on an average 1 times in the middle of the night and has to go to the bathroom 0 times on a typical night. He reports frequent morning headaches, which are L sided.   He denies frank excessive daytime somnolence (EDS) and His Epworth Sleepiness Score (ESS) is 5/24 today. He has not fallen asleep while driving. The patient has not been taking a planned nap. 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 He snores mildly and it is unclear, if there are any apneas. He himself does not note any gasping sensations while asleep. Somehow or another he still does not wake up rested. He does achieve a good amount of sleep. His mother has sleep apnea and is on a CPAP machine. His mother also has RLS symptoms. He has some restless leg symptoms and describes an aching sensation encouraged to move his right leg only. It helps to get up and walk around and stretch his right leg after which his symptoms subside typically. He grinds his teeth and has been using an over-the-counter bite guard. He wakes up with nightmares sometimes. He does talk in his sleep. He does not walk in his sleep. He has acid reflux symptoms sometimes and often wakes up with a dry mouth from mouth breathing and has mucus. He does not typically suffer from postnasal drip. He drinks quite a bit of caffeine. He drinks soda approximately 4 times a day, and tries not to drink any caffeine after 4 PM. He does not smoke and rarely drinks alcohol.   His Past Medical History Is Significant For: Past Medical History:  Diagnosis Date   Anxiety    Asthma    GERD (gastroesophageal reflux disease)     His Past Surgical History Is  Significant For: Past Surgical History:  Procedure Laterality Date  FINGER DEBRIDEMENT  07/2003   WISDOM TOOTH EXTRACTION  2001    His Family History Is Significant For: Family History  Problem Relation Age of Onset   Lung cancer Maternal Grandfather    Prostate cancer Paternal Grandfather    Sleep apnea Mother    Diabetes Paternal Grandmother    Arthritis Father     His Social History Is Significant For: Social History   Socioeconomic History   Marital status: Married    Spouse name: Cana   Number of children: Not on file   Years of education: 12+   Highest education level: Not on file  Occupational History    Employer: [employer]: Works at [employer] Use   Smoking status: Never   Smokeless tobacco: Never  Substance and Sexual Activity   Alcohol use: Yes    Alcohol/week: 0.0 standard drinks    Comment: 2 x monthly   Drug use: No   Sexual activity: Not on file  Other Topics Concern   Not on file  Social History Narrative   Patient consumes 2-3 caffeine drinks daily   Social Determinants of Health   Financial Resource Strain: Not on file  Food Insecurity: Not on file  Transportation Needs: Not on file  Physical Activity: Not on file  Stress: Not on file  Social Connections: Not on file    His Allergies Are:  Allergies  Allergen Reactions   Androgens Other (See Comments)    Steroids aggravate an eye condition     Codeine Nausea Only   Oxycodone Nausea Only  :   His Current Medications Are:  Outpatient Encounter Medications as of 01/28/2022  Medication Sig   buPROPion (WELLBUTRIN XL) 150 MG 24 hr tablet Take 150 mg by mouth every morning.   Multiple Vitamin (MULTIVITAMIN) tablet Take 1 tablet by mouth daily.   [DISCONTINUED] famotidine (PEPCID) 10 MG tablet Take 10 mg by mouth daily.   Facility-Administered Encounter Medications as of 01/28/2022  Medication   gadopentetate dimeglumine (MAGNEVIST) injection 20  mL  :  Review of Systems:  Out of a complete 14 point review of systems, all are reviewed and negative with the exception of these symptoms as listed below:  Review of Systems  Neurological:        CPAP f/u. Doing well.  No issues. 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   Objective:  Neurological Exam  Physical Exam Physical Examination:   Vitals:   01/28/22 0821  BP: 132/80  Pulse: 83    General Examination: The patient is a very pleasant 41 y.o. male in no acute distress. He appears well-developed and well-nourished and well groomed.   HEENT: Normocephalic, atraumatic, pupils are equal, round and reactive to light, tracking well-preserved, face is symmetric with normal facial animation. Speech is clear with no dysarthria noted. There is no hypophonia. There is no lip, neck/head, jaw or voice tremor. Oropharynx exam reveals: mild mouth dryness, good dental hygiene and mild to perhaps moderate airway crowding. Mallampati is class II. Tongue protrudes centrally and palate elevates symmetrically. Tonsils are about 1+ in size.     Chest: Clear to auscultation without wheezing, rhonchi or crackles noted.   Heart: S1+S2+0, regular and normal without murmurs, rubs or gallops noted.    Abdomen: Soft, non-tender and non-distended.   Skin: Warm and dry without trophic changes noted.    Musculoskeletal: exam reveals no obvious joint deformities.    Neurologically:  Mental  status: The patient is awake, alert and oriented in all 4 spheres. His immediate and remote memory, attention, language skills and fund of knowledge are appropriate. There is no evidence of aphasia, agnosia, apraxia or anomia. Speech is clear with normal prosody and enunciation. Thought process is linear. Mood is normal and affect is normal.  Cranial nerves II - XII are as described above under HEENT exam. Motor exam: Normal bulk, strength and tone is noted. There is no obvious tremor.  Fine motor skills and coordination:  intact with normal finger taps, normal hand movements, normal rapid alternating patting, normal foot taps and normal foot agility.  Cerebellar testing: No dysmetria or intention tremor on finger to nose testing. Heel to shin is unremarkable bilaterally. There is no truncal or gait ataxia.  Sensory exam: intact to light touch.  Gait, station and balance: He stands easily. No veering to one side is noted. No leaning to one side is noted. Posture is age-appropriate and stance is narrow based. Gait shows normal stride length and normal pace. No problems turning are noted.   Assessment and Plan:    In summary, ULISES WOLFINGER is a very pleasant 41 year old male with an underlying medical history of reflux disease, asthma, anxiety, and obesity, who presents for follow-up consultation of his obstructive sleep apnea, well-established on treatment with CPAP since June 2015 with good results and ongoing excellent compliance.  Of note, his machine is nearly 40 years old and he has an error message on it that the motor life has been exceeded.  We talked about this today, he is advised that if the machine gives out without warning, then we will be in a bind.  I would like to be more proactive and proceed with a home sleep test to re- establish a sleep apnea diagnosis and based on the home sleep test I would like to write a new machine, he likes the AirSense 10 and we can stay with that or request the ResMed air sense 11 AutoSet.  He is advised to continue to be fully compliant in the interim but not use his machine at the time of his home sleep test when scheduled.  He is advised that we will keep him posted as to his results and proceed with a prescription for new equipment afterwards.  He is advised to follow-up routinely in 1 year but we will also have to make a compliance visit appointment according to his set up on his new equipment.  He is commended for his treatment adherence.  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  We provided a 1 year compliance report.   I answered all his questions today and he was in agreement with our plan.   I spent 30 minutes in total face-to-face time and in reviewing records during pre-charting, more than 50% of which was spent in counseling and coordination of care, reviewing test results, reviewing medications and treatment regimen and/or in discussing or reviewing the diagnosis of OSA, the prognosis and treatment options. Pertinent laboratory and imaging test results that were available during this visit with the patient were reviewed by me and considered in my medical decision making (see chart for details).

## 2022-01-28 NOTE — Patient Instructions (Addendum)
Please continue using your CPAP regularly. While your insurance requires that you use CPAP at least 4 hours each night on 70% of the nights, I recommend, that you not skip any nights and use it throughout the night if you can. Getting used to CPAP and staying with the treatment long term does take time and patience and discipline. Untreated obstructive sleep apnea when it is moderate to severe can have an adverse impact on cardiovascular health and raise her risk for heart disease, arrhythmias, hypertension, congestive heart failure, stroke and diabetes. Untreated obstructive sleep apnea causes sleep disruption, nonrestorative sleep, and sleep deprivation. This can have an impact on your day to day functioning and cause daytime sleepiness and impairment of cognitive function, memory loss, mood disturbance, and problems focussing. Using CPAP regularly can improve these symptoms. ?We can see you in 1 year, you can see one of our nurse practitioners.  ? ?As discussed, we will pursue a home sleep test in the interim to qualify you for a new machine as your machine is nearly 40 years old and the motor life has been exceeded and your machine may give up at some point without warning.  We will call you to schedule your home sleep test, please remember not to use your CPAP machine at the night of your testing at home.  We will call you with the results or email you through [place] and I would like to issue a new machine, we can stay with a ResMed AirSense 10 model if you like or go to ResMed air sense 11 AutoSet.  Remember, you will need a follow-up appointment within 1 to 3 months after set up with your new machine for insurance purposes. ?

## 2022-02-03 ENCOUNTER — Telehealth: Payer: Self-pay | Admitting: Neurology

## 2022-02-03 NOTE — Telephone Encounter (Signed)
HST- [employer] Josem Kaufmann: [id number] (exp. 02/02/22 to 04/02/22). Patient is scheduled at [facility] for 02/10/22 at 8:30 AM.  ?

## 2022-02-10 ENCOUNTER — Ambulatory Visit: Payer: BC Managed Care – PPO | Admitting: Neurology

## 2022-02-10 DIAGNOSIS — G4733 Obstructive sleep apnea (adult) (pediatric): Secondary | ICD-10-CM | POA: Diagnosis not present

## 2022-02-16 NOTE — Procedures (Signed)
   [facility]  HOME SLEEP TEST (Watch PAT) REPORT  STUDY DATE: 02/10/2022  DOB: 12/28/1980  MRN: [id number]  ORDERING CLINICIAN: Star Age, MD, PhD   CLINICAL INFORMATION/HISTORY: 41 year old right-handed gentleman with an underlying medical history of reflux disease, asthma, anxiety, and obesity, who presents for presents for reevaluation of his obstructive sleep apnea.  He been stable on PAP therapy with good compliance but has an older machine; he should be eligible for new equipment.  BMI: 41 kg/m  FINDINGS:   Sleep Summary:   Total Recording Time (hours, min): 7 hours, 54 minutes  Total Sleep Time (hours, min):  6 hours, 58 minutes   Percent REM (%):    32.8%   Respiratory Indices:   Calculated pAHI (per hour):  27.5/hour         REM pAHI:    22.6/hour       NREM pAHI: 29.8/hour  Oxygen Saturation Statistics:    Oxygen Saturation (%) Mean: 95%   Minimum oxygen saturation (%):                 86%   O2 Saturation Range (%): 86-99%    O2 Saturation (minutes) <=88%: 0.2 min  Pulse Rate Statistics:   Pulse Mean (bpm):    72/min    Pulse Range (50-103/min)   IMPRESSION: OSA (obstructive sleep apnea)   RECOMMENDATION:  This home sleep test demonstrates moderate obstructive sleep apnea with a total AHI of 27.5/hour and O2 nadir of 86%.  Mild to moderate snoring was detected.  Ongoing treatment with positive airway pressure is recommended. The patient should be eligible for a new PAP machine and I will write for a new AutoPap machine, pressure of 7 to 11 cm, current CPAP is set at 9 cm with good apnea control.  We can also request a set pressure of 9 cm if he desires. A full night titration study may be considered to optimize treatment settings, if needed down the road. Please note that untreated obstructive sleep apnea may carry additional perioperative morbidity. Patients with significant obstructive sleep apnea should receive perioperative PAP  therapy and the surgeons and particularly the anesthesiologist should be informed of the diagnosis and the severity of the sleep disordered breathing.  Alternative treatment options such as surgical option with inspire or a dental device can be considered if the need arises.  Concomitant weight loss is recommended. The patient should be cautioned not to drive, work at heights, or operate dangerous or heavy equipment when tired or sleepy. Review and reiteration of good sleep hygiene measures should be pursued with any patient. Other causes of the patient's symptoms, including circadian rhythm disturbances, an underlying mood disorder, medication effect and/or an underlying medical problem cannot be ruled out based on this test. Clinical correlation is recommended. The patient and his referring provider will be notified of the test results. The patient will be seen in follow up in sleep clinic at [facility].  I certify that I have reviewed the raw data recording prior to the issuance of this report in accordance with the standards of the American Academy of Sleep Medicine (AASM).  INTERPRETING PHYSICIAN:   Star Age, MD, PhD  Board Certified in Neurology and Sleep Medicine  [facility] [address], [place], [place] [address] [phone]

## 2022-02-16 NOTE — Progress Notes (Signed)
See procedure note.

## 2022-02-16 NOTE — Addendum Note (Signed)
Addended by: Star Age on: 02/16/2022 04:10 PM   Modules accepted: Orders

## 2022-02-17 ENCOUNTER — Telehealth: Payer: Self-pay | Admitting: Neurology

## 2022-02-17 NOTE — Telephone Encounter (Signed)
-----   Message from Star Age, MD sent at 02/16/2022  4:10 PM EDT ----- I recently saw him in follow-up, he has been on CPAP therapy for years and doing well but was eligible for new equipment.  We did a home sleep test for reevaluation.  Please advise patient that his home sleep test from 02/10/2022 confirmed moderate obstructive sleep apnea.  I would like to write for a new AutoPap machine, pressure of 7 to 11 cm, current CPAP is set at 9 cm.  If he does not like the AutoPap feature, we can always switch him to a set pressure of 9 cm later on, he can send [place] an email for an update through [facility].  He will need a follow-up within 1 to 3 months after starting treatment with his new machine.  Please advise him to continue to be fully compliant with his new equipment, and after this interim follow-up we can revert to yearly checkups.

## 2022-02-17 NOTE — Telephone Encounter (Signed)
I called pt. I advised pt that Dr. Rexene Alberts reviewed their sleep study results and found that pt has moderate sleep apnea. Dr. Rexene Alberts recommends that pt gets a new machine and will order auto CPAP. I reviewed PAP compliance expectations with the pt. Pt is agreeable to starting a CPAP. I advised pt that an order will be sent to a DME, [facility], and [facility] will call the pt within about one week after they file with the pt's insurance. [facility] will show the pt how to use the machine, fit for masks, and troubleshoot the CPAP if needed. A follow up appt was made for insurance purposes with Dr. Rexene Alberts on 04/27/2022 at 7:45 am. Pt verbalized understanding to arrive 15 minutes early and bring their CPAP. A letter with all of this information in it will be mailed to the pt as a reminder. I verified with the pt that the address we have on file is correct. Pt verbalized understanding of results. Pt had no questions at this time but was encouraged to call back if questions arise. I have sent the order to [facility]  and have received confirmation that they have received the order.

## 2022-03-10 DIAGNOSIS — G4733 Obstructive sleep apnea (adult) (pediatric): Secondary | ICD-10-CM | POA: Diagnosis not present

## 2022-04-09 DIAGNOSIS — G4733 Obstructive sleep apnea (adult) (pediatric): Secondary | ICD-10-CM | POA: Diagnosis not present

## 2022-04-12 DIAGNOSIS — M722 Plantar fascial fibromatosis: Secondary | ICD-10-CM | POA: Diagnosis not present

## 2022-04-16 DIAGNOSIS — G4733 Obstructive sleep apnea (adult) (pediatric): Secondary | ICD-10-CM | POA: Diagnosis not present

## 2022-04-27 ENCOUNTER — Encounter: Payer: Self-pay | Admitting: Neurology

## 2022-04-27 ENCOUNTER — Ambulatory Visit: Payer: BC Managed Care – PPO | Admitting: Neurology

## 2022-04-27 VITALS — BP 138/96 | HR 78 | Ht 70.0 in | Wt 289.2 lb

## 2022-04-27 DIAGNOSIS — G4733 Obstructive sleep apnea (adult) (pediatric): Secondary | ICD-10-CM

## 2022-04-27 DIAGNOSIS — Z9989 Dependence on other enabling machines and devices: Secondary | ICD-10-CM

## 2022-04-27 NOTE — Patient Instructions (Signed)
It was nice to see you again today.  I am glad you have adjusted well to your new machine, it looks like it has stopped transmitting data at the end of July for some reason, please check with your DME company for troubleshooting, I do not think it has to do with your app but more with your machine.   You are fully compliant with treatment, numbers look good including your leak is acceptable from the mask as well as your apnea scores are at goal.   Please follow-up routinely as scheduled for May 2024, continue using your autoPAP regularly. While your insurance requires that you use PAP at least 4 hours each night on 70% of the nights, I recommend, that you not skip any nights and use it throughout the night if you can. Getting used to PAP and staying with the treatment long term does take time and patience and discipline. Untreated obstructive sleep apnea when it is moderate to severe can have an adverse impact on cardiovascular health and raise her risk for heart disease, arrhythmias, hypertension, congestive heart failure, stroke and diabetes. Untreated obstructive sleep apnea causes sleep disruption, nonrestorative sleep, and sleep deprivation. This can have an impact on your day to day functioning and cause daytime sleepiness and impairment of cognitive function, memory loss, mood disturbance, and problems focussing. Using PAP regularly can improve these symptoms.

## 2022-04-27 NOTE — Progress Notes (Signed)
Subjective:    Patient ID: Caleb Vega is a 41 y.o. male.  HPI    Interim history:  Mr. Parlee is a 41 year old right-handed gentleman with an underlying medical history of reflux disease, asthma, anxiety, and obesity, who presents for follow-up consultation of his obstructive sleep apnea, after interim home sleep testing and starting treatment with a new AutoPap machine.  The patient is unaccompanied today.  I last saw him on 01/28/2022, at which time he was compliant with his CPAP of 9 cm.  His machine was old and he had seen an error message on it that the mother live had been exceeded.  He was advised to proceed with a home sleep test, which he had on 02/10/2022, AHI was 27.5/h, O2 nadir 86%, mild to moderate snoring detected.  He was advised to start treatment with a new AutoPap machine.  His set up date was 03/10/2022, he has a ResMed AirSense 11 AutoSet machine.    Today, 04/27/2022: I reviewed his AutoPap compliance data from 03/20/2022 through 04/18/2022, which is a total of 30 days, during which time he used his machine every night with percent use days greater than 4 hours at 100%, indicating superb compliance, average usage of 7 hours and 52 minutes, residual AHI 1.6/h, leak acceptable with the 95th percentile at 10.4 L/min, average pressure for the 95th percentile at 10.5 cm with a range of 9 cm to 11 cm with EPR.  He reports doing well, he has adjusted well to his new machine and the AutoPap feature.  He is compliant with treatment and motivated to continue with it.  He has noticed that his app was not showing data after July 30.  In fact, her download also does not extend beyond July 30.  He is advised to talk to his DME provider, there may be some glitch in the machine, he does not have a data card in the machine and did not bring the machine, we did have access to remote data but not beyond July 30 for some reason.  He uses nasal pillows.  He has had a little bit of stress this morning which  probably explains the elevated blood pressure, he had some Excedrin and also some caffeine, he reports having had some tendinitis in his right elbow and took medication for planter fasciitis for that reason.  He was also rushing as he went to work earlier this morning, just came back from vacation.  Overall, he is doing well.    The patient's allergies, current medications, family history, past medical history, past social history, past surgical history and problem list were reviewed and updated as appropriate.    Previously (copied from previous notes for reference):     I reviewed his CPAP compliance data from the past 90 days from 10/30/2021 through 01/27/2022, during which time he used his machine every night except for 1 night, percent use days greater than 4 hours at 99% which is excellent, residual AHI at goal at 1.4/h, pressure 9 cm without EPR.  Leak acceptable with the 95th percentile at 9.5.  I saw him in a VV on 02/14/2019, at which time he was compliant with his CPAP and doing well.  He was advised to follow-up routinely in 1 year.   He saw Ward Givens, NP on 02/14/2020, at which time he was compliant with his CPAP and doing well.   He saw Ward Givens, NP on 02/11/2021 for his yearly checkup and was stable, doing well with  his CPAP of 9 cm with good apnea control.      I saw him on 02/06/2018, at which time he was fully compliant with CPAP and doing well and encouraged to follow-up in 1 year.      I reviewed his CPAP compliance data from 01/08/2019 through 02/06/2019 which is a total of 30 days, during which time he used his machine every night with percent used days greater than 4 hours at 100%, indicating superb compliance with an average usage of 8 hours and 7 minutes which is great, residual AHI at goal at 1.7/h, leak acceptable with a 95th percentile at 16.6 L/min on a pressure of 9 cm.   I saw him on 02/02/2017, at which time he was compliant with CPAP therapy. He had some  recurrence of his morning headaches which were typically left-sided. His exam is nonfocal, nevertheless I suggested we proceed with a brain scan to rule out a structural cause of his one-sided headaches. His brain MRI with and without contrast on 03/30/2017 which showed normal findings. We called him with his test results.    I reviewed his CPAP compliance data from 01/03/2018 through 02/01/2018 which is a total of 30 days, during which time he used his CPAP every night with percent used days greater than 4 hours at 100%, indicating superb compliance with an average usage of 8 hours and 5 minutes, residual AHI at goal at 1.4 per hour, leak acceptable with the 95th percentile at 13.4 L/m on a pressure of 9 cm.    I saw him on 02/04/2016, at which time he reported doing well. He was trying to lose weight. He had a change in his job, was traveling less, less stress. He was compliant with CPAP therapy.    I reviewed his CPAP compliance data from 01/02/2017 through 01/31/2017 which is a total of 30 days, during which time he used his CPAP 27 days with percent used days greater than 4 hours at 90%, indicating excellent compliance with an average usage of 8 hours and 24 minutes, residual AHI 1.7 per hour, leak acceptable with the 95th percentile at 10.6 L/m on a pressure of 9 cm.  I saw him on 01/29/2015, at which time he was doing well. He was compliant with treatment. He had no new complaints. I suggested a one-year checkup.   I reviewed his CPAP compliance data from 01/04/2016 through 02/02/2016 which is a total of 30 days during which time he used his CPAP 28 days with percent used days greater than 4 hours at 93%, indicating excellent compliance with an average usage of 7 hours and 40 minutes, residual AHI 1.9 per hour, leak acceptable for the 95th percentile at 12 L/m on a pressure of 9 cm.   I saw him on 07/29/2014, at which time we discussed sleep test results. He was compliant with treatment and  reported doing well, , he was sleeping better and felt more rested. He had some mild nasal soreness and congestion. His nocturia was improved, and his morning headaches were essentially gone.    I reviewed his CPAP compliance data from 12/29/2014 through 01/27/2015 which is a total of 30 days during which time he used his machine every night except for 1 night. Percent used days greater than 4 hours was 93%, indicating excellent compliance with an average usage of 7 hours and 25 minutes, residual AHI good at 2 per hour and leak acceptable with the 95th percentile at 9.7 L/m on a  pressure of 9 cm with EPR of 1.    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 He reported recurrent morning headaches and daytime somnolence with mild snoring reported. I asked him to return for a sleep study.    He had a split-night sleep study on 01/13/2014 and went over his test results with him in detail today. His baseline sleep efficiency was reduced at 80.7% with a latency to sleep of 11 minutes and wake after sleep onset of 18 minutes with moderate sleep fragmentation noted. He had an elevated arousal index. He had elevated stage II sleep, 21.5% of slow-wave sleep, and absence of REM sleep. He had moderate snoring. Total AHI was 39.2 per hour. Baseline oxygen saturation was 93%, nadir was 88%. He was then titrated on CPAP. Sleep efficiency was 82.7%, latency to sleep of 17.5 minutes and wake after sleep onset of 36.5 minutes with mild sleep fragmentation noted. His arousal index was improved. There was an increased percentage of REM sleep. Average oxygen saturation was 94%, nadir of 84%. He had no significant periodic leg movements of sleep before or after CPAP. He was titrated from 5-9 cm of water. AHI was reduced to 0.7 per hour at the final pressure with supine REM sleep achieved.    I reviewed the patient's  CPAP compliance data from 03/19/2014 to 04/17/2014, which is a total of 30 days, during which time the patient used CPAP every day. The average usage for all days was 7 hours and 37 minutes. The percent used days greater than 4 hours was 93 %, indicating excellent compliance. The residual AHI was 1.8 per hour, indicating an adequate treatment pressure of 9 cwp with EPR of 1. Air leak from the mask was very low.   I reviewed the patient's CPAP compliance data from 06/01/2014 to 06/30/2014, which is a total of 30 days, during which time the patient used CPAP every day except for 2 days. The average usage for all days was 7 hours and 17 minutes. The percent used days greater than 4 hours was 90 %, indicating excellent compliance. The residual AHI was 2.8 per hour, indicating an appropriate treatment pressure of 9 cwp with EPR of 1. Air leak from the mask was acceptable and typically low.   I reviewed his compliance data from 06/26/2014 through 07/25/2014 which is a total of 30 days during which time he used his machine every night except for one night. Percent used days greater than 4 hours was 93%, indicating excellent compliance, residual AHI 2.4 per hour, leak low. Pressure at 9 cm with EPR of 1. Average usage of 7 hours and 37 minutes.   His typical bedtime is reported to be around 9 to 10:30 PM and usual wake time is around 6:45 to 7:30 AM. Sleep onset typically occurs within 15-30 minutes. He reports feeling marginally rested upon awakening. He wakes up on an average 1 times in the middle of the night and has to go to the bathroom 0 times on a typical night. He reports frequent morning headaches, which are L sided.   He denies frank excessive daytime somnolence (EDS) and His Epworth Sleepiness Score (ESS) is 5/24 today. He has not fallen asleep while driving. The patient has not been taking a planned nap. 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 He snores mildly and it is unclear, if there are any apneas. He himself does not note any gasping sensations while asleep. Somehow or another he still does not wake up rested. He does achieve a good amount of sleep. His mother has sleep apnea and is on a CPAP machine. His mother also has RLS symptoms. He has some restless leg symptoms and describes an aching sensation encouraged to move his right leg only. It helps to get up and walk around and stretch his right leg after which his symptoms subside typically. He grinds his teeth and has been using an over-the-counter bite guard. He wakes up with nightmares sometimes. He does talk in his sleep. He does not walk in his sleep. He has acid reflux symptoms sometimes and often wakes up with a dry mouth from mouth breathing and has mucus. He does not typically suffer from postnasal drip. He drinks quite a bit of caffeine. He drinks soda approximately 4 times a day, and tries not to drink any caffeine after 4 PM. He does not smoke and rarely drinks alcohol.   His Past Medical History Is Significant For: Past Medical History:  Diagnosis Date   Anxiety    Asthma    GERD (gastroesophageal reflux disease)     His Past Surgical History Is Significant For: Past Surgical History:  Procedure Laterality Date   FINGER DEBRIDEMENT  07/2003   WISDOM TOOTH EXTRACTION  2001    His Family History Is Significant For: Family History  Problem Relation Age of Onset   Lung cancer Maternal Grandfather    Prostate cancer Paternal Grandfather    Sleep apnea Mother    Diabetes Paternal Grandmother    Arthritis Father     His Social History Is Significant For: Social History   Socioeconomic History   Marital status: Married    Spouse name: Cana   Number of children: Not on file   Years of education: 12+   Highest education level: Not on file  Occupational History    Employer: [employer]: Works at [employer] Use   Smoking status: Never   Smokeless tobacco: Never  Substance and Sexual Activity   Alcohol use: Yes    Alcohol/week: 0.0 standard drinks of alcohol    Comment: 2 x monthly   Drug use: No   Sexual activity: Not on file  Other Topics Concern   Not on file  Social History Narrative   Patient consumes 2-3 caffeine drinks daily   Social Determinants of Health   Financial Resource Strain: Not on file  Food Insecurity: Not on file  Transportation Needs: Not on file  Physical Activity: Not on file  Stress: Not on file  Social Connections: Not on file    His Allergies Are:  Allergies  Allergen Reactions   Androgens Other (See Comments)    Steroids aggravate an eye condition     Codeine Nausea Only   Oxycodone Nausea Only  :   His Current Medications Are:  Outpatient Encounter Medications as of 04/27/2022  Medication Sig   buPROPion (WELLBUTRIN XL) 150 MG 24 hr tablet Take 150 mg by mouth every morning.   Multiple Vitamin (MULTIVITAMIN) tablet Take 1 tablet by mouth daily.   Facility-Administered Encounter Medications as of 04/27/2022  Medication   gadopentetate dimeglumine (MAGNEVIST) injection 20 mL  :  Review of Systems:  Out of a complete  14 point review of systems, all are reviewed and negative with the exception of these symptoms as listed below:  Review of Systems  Neurological:        Pt here for CPAP follow up  Pt had questions about his machine DL report to his phone . Made patient aware he can call [facility](DME) for assistance    ESS:5    Objective:  Neurological Exam  Physical Exam Physical Examination:   Vitals:   04/27/22 0746  BP: (!) 138/96  Pulse: 78    General Examination: The patient is a very pleasant 41 y.o. male in no acute distress. He appears well-developed and well-nourished and well groomed.   HEENT: Normocephalic, atraumatic, pupils are equal, round and reactive to light,  tracking well-preserved, face is symmetric with normal facial animation. Speech is clear with no dysarthria noted. There is no hypophonia. There is no lip, neck/head, jaw or voice tremor. Oropharynx exam reveals: mild mouth dryness, good dental hygiene and mild to moderate airway crowding. Mallampati is class II. Tongue protrudes centrally and palate elevates symmetrically. Tonsils are about 1+ in size.     Chest: Clear to auscultation without wheezing, rhonchi or crackles noted.   Heart: S1+S2+0, regular and normal without murmurs, rubs or gallops noted.    Abdomen: Soft, non-tender and non-distended.   Skin: Warm and dry without trophic changes noted.    Musculoskeletal: exam reveals no obvious joint deformities.  Brace right elbow, tendinitis.   Neurologically:  Mental status: The patient is awake, alert and oriented in all 4 spheres. His immediate and remote memory, attention, language skills and fund of knowledge are appropriate. There is no evidence of aphasia, agnosia, apraxia or anomia. Speech is clear with normal prosody and enunciation. Thought process is linear. Mood is normal and affect is normal.  Cranial nerves II - XII are as described above under HEENT exam. Motor exam: Normal bulk, strength and tone is noted. There is no obvious tremor.  Fine motor skills and coordination: Grossly intact.  Cerebellar testing: No dysmetria or intention tremor. There is no truncal or gait ataxia.  Sensory exam: intact to light touch.  Gait, station and balance: He stands easily. No veering to one side is noted. No leaning to one side is noted. Posture is age-appropriate and stance is narrow based. Gait shows normal stride length and normal pace. No problems turning are noted.   Assessment and Plan:    In summary, CLAYBORN MILNES is a very pleasant 41 year old male with an underlying medical history of reflux disease, asthma, anxiety, and obesity, who presents for follow-up consultation of his  obstructive sleep apnea, well-established on treatment with CPAP since June 2015 with good results and ongoing excellent compliance.  Of note, his machine was 40 years old and we reevaluated his sleep apnea with a recent home sleep test on 02/10/2022.  His AHI was 27.5/h, O2 nadir 86% with mild to moderate snoring detected.  He established treatment with a new AutoPap machine on 03/10/2022 and is compliant with treatment.  His AHI is at goal, leak acceptable, he uses nasal pillows.  His machine for some reason has stopped transmitting on his app and also on the website after 04/18/2022.  He is advised to get in touch with his DME company about this.  He is commended for his treatment adherence and advised to follow-up routinely next year, he already has an appointment for May 2020 for scheduled which he can keep.  I answered all  his questions today and he was in agreement with our plan.   I spent 20 minutes in total face-to-face time and in reviewing records during pre-charting, more than 50% of which was spent in counseling and coordination of care, reviewing test results, reviewing medications and treatment regimen and/or in discussing or reviewing the diagnosis of OSA, the prognosis and treatment options. Pertinent laboratory and imaging test results that were available during this visit with the patient were reviewed by me and considered in my medical decision making (see chart for details).

## 2022-05-10 DIAGNOSIS — G4733 Obstructive sleep apnea (adult) (pediatric): Secondary | ICD-10-CM | POA: Diagnosis not present

## 2022-05-17 DIAGNOSIS — G4733 Obstructive sleep apnea (adult) (pediatric): Secondary | ICD-10-CM | POA: Diagnosis not present

## 2022-06-10 DIAGNOSIS — G4733 Obstructive sleep apnea (adult) (pediatric): Secondary | ICD-10-CM | POA: Diagnosis not present

## 2022-06-17 DIAGNOSIS — G4733 Obstructive sleep apnea (adult) (pediatric): Secondary | ICD-10-CM | POA: Diagnosis not present

## 2022-07-10 DIAGNOSIS — G4733 Obstructive sleep apnea (adult) (pediatric): Secondary | ICD-10-CM | POA: Diagnosis not present

## 2022-08-06 DIAGNOSIS — Z23 Encounter for immunization: Secondary | ICD-10-CM | POA: Diagnosis not present

## 2022-08-06 DIAGNOSIS — F411 Generalized anxiety disorder: Secondary | ICD-10-CM | POA: Diagnosis not present

## 2022-08-06 DIAGNOSIS — G4730 Sleep apnea, unspecified: Secondary | ICD-10-CM | POA: Diagnosis not present

## 2022-08-06 DIAGNOSIS — Z86010 Personal history of colonic polyps: Secondary | ICD-10-CM | POA: Diagnosis not present

## 2022-08-10 DIAGNOSIS — G4733 Obstructive sleep apnea (adult) (pediatric): Secondary | ICD-10-CM | POA: Diagnosis not present

## 2022-08-27 DIAGNOSIS — Z13220 Encounter for screening for lipoid disorders: Secondary | ICD-10-CM | POA: Diagnosis not present

## 2022-08-27 DIAGNOSIS — Z6839 Body mass index (BMI) 39.0-39.9, adult: Secondary | ICD-10-CM | POA: Diagnosis not present

## 2022-08-27 DIAGNOSIS — R7309 Other abnormal glucose: Secondary | ICD-10-CM | POA: Diagnosis not present

## 2022-08-27 DIAGNOSIS — Z0000 Encounter for general adult medical examination without abnormal findings: Secondary | ICD-10-CM | POA: Diagnosis not present

## 2022-09-03 DIAGNOSIS — T781XXA Other adverse food reactions, not elsewhere classified, initial encounter: Secondary | ICD-10-CM | POA: Diagnosis not present

## 2022-09-03 DIAGNOSIS — Z0000 Encounter for general adult medical examination without abnormal findings: Secondary | ICD-10-CM | POA: Diagnosis not present

## 2022-09-09 DIAGNOSIS — G4733 Obstructive sleep apnea (adult) (pediatric): Secondary | ICD-10-CM | POA: Diagnosis not present

## 2022-09-27 DIAGNOSIS — J029 Acute pharyngitis, unspecified: Secondary | ICD-10-CM | POA: Diagnosis not present

## 2022-09-27 DIAGNOSIS — R059 Cough, unspecified: Secondary | ICD-10-CM | POA: Diagnosis not present

## 2022-09-27 DIAGNOSIS — Z03818 Encounter for observation for suspected exposure to other biological agents ruled out: Secondary | ICD-10-CM | POA: Diagnosis not present

## 2022-09-27 DIAGNOSIS — H66001 Acute suppurative otitis media without spontaneous rupture of ear drum, right ear: Secondary | ICD-10-CM | POA: Diagnosis not present

## 2022-09-27 DIAGNOSIS — R0981 Nasal congestion: Secondary | ICD-10-CM | POA: Diagnosis not present

## 2022-09-27 DIAGNOSIS — J019 Acute sinusitis, unspecified: Secondary | ICD-10-CM | POA: Diagnosis not present

## 2022-10-10 DIAGNOSIS — G4733 Obstructive sleep apnea (adult) (pediatric): Secondary | ICD-10-CM | POA: Diagnosis not present

## 2022-11-10 DIAGNOSIS — G4733 Obstructive sleep apnea (adult) (pediatric): Secondary | ICD-10-CM | POA: Diagnosis not present

## 2022-12-09 DIAGNOSIS — G4733 Obstructive sleep apnea (adult) (pediatric): Secondary | ICD-10-CM | POA: Diagnosis not present

## 2022-12-24 DIAGNOSIS — L4050 Arthropathic psoriasis, unspecified: Secondary | ICD-10-CM | POA: Diagnosis not present

## 2023-01-26 DIAGNOSIS — M79641 Pain in right hand: Secondary | ICD-10-CM | POA: Diagnosis not present

## 2023-01-26 DIAGNOSIS — R21 Rash and other nonspecific skin eruption: Secondary | ICD-10-CM | POA: Diagnosis not present

## 2023-01-26 DIAGNOSIS — M79642 Pain in left hand: Secondary | ICD-10-CM | POA: Diagnosis not present

## 2023-01-26 DIAGNOSIS — M2560 Stiffness of unspecified joint, not elsewhere classified: Secondary | ICD-10-CM | POA: Diagnosis not present

## 2023-01-26 DIAGNOSIS — M2540 Effusion, unspecified joint: Secondary | ICD-10-CM | POA: Diagnosis not present

## 2023-01-27 ENCOUNTER — Ambulatory Visit: Payer: BC Managed Care – PPO | Admitting: Adult Health

## 2023-02-16 DIAGNOSIS — M79642 Pain in left hand: Secondary | ICD-10-CM | POA: Diagnosis not present

## 2023-02-16 DIAGNOSIS — M79641 Pain in right hand: Secondary | ICD-10-CM | POA: Diagnosis not present

## 2023-02-16 DIAGNOSIS — R21 Rash and other nonspecific skin eruption: Secondary | ICD-10-CM | POA: Diagnosis not present

## 2023-02-16 DIAGNOSIS — M2540 Effusion, unspecified joint: Secondary | ICD-10-CM | POA: Diagnosis not present

## 2023-02-22 ENCOUNTER — Encounter: Payer: Self-pay | Admitting: Neurology

## 2023-02-22 ENCOUNTER — Ambulatory Visit: Payer: BC Managed Care – PPO | Admitting: Neurology

## 2023-02-22 VITALS — BP 136/83 | HR 77 | Ht 71.0 in | Wt 279.0 lb

## 2023-02-22 DIAGNOSIS — G4733 Obstructive sleep apnea (adult) (pediatric): Secondary | ICD-10-CM

## 2023-02-22 NOTE — Patient Instructions (Addendum)
It was nice to see you again today.  Keep up the good work and continue with your CPAP on the current settings.  Follow-up in 1 year to see one of our nurse practitioners. You had seen Megan before and can schedule a video visit with her.

## 2023-02-22 NOTE — Progress Notes (Signed)
Subjective:    Patient ID: Caleb Vega is a 42 y.o. male.  HPI    Interim history:   Caleb Vega is a 42 year old right-handed gentleman with an underlying medical history of reflux disease, asthma, anxiety, and obesity, who presents for follow-up consultation of his obstructive sleep apnea, on autoPAP therapy.  The patient is unaccompanied today.  I last saw him on 04/27/2022, at which time he was compliant with his new AutoPap machine.  He had received a new machine after his home sleep test in May 2023.  Today, 02/22/2023: I reviewed his AutoPap compliance data from 01/22/2023 through 02/20/2023, which is a total of 30 days, during which time he used his machine every night with percent use days greater than 4 hours at 100%, indicating superb compliance with an average usage of 7 hours and 8 minutes, residual AHI at goal at 1.3/h, 95th percentile of pressure at 10.5 cm with a range of 9 to 11 cm with EPR of 2.  Leak on the higher end with the 95th percentile at 20.3 L/min.  He reports doing well with his AutoPap machine, is usually up-to-date with supplies and does not need any prescription for supplies, uses nasal pillows.  He has had several arthritis issues, has seen a rheumatologist and reports that he was ruled out for rheumatoid arthritis, gout and psoriatic arthritis but takes a prescription for Mobic which helps, mostly his arthritis is affecting his index finger joints, right more than left.  He has not tried Voltaren topically.   The patient's allergies, current medications, family history, past medical history, past social history, past surgical history and problem list were reviewed and updated as appropriate.    Previously (copied from previous notes for reference):     I saw him on 01/28/2022, at which time he was compliant with his CPAP of 9 cm.  His machine was old and he had seen an error message on it that the motor life had been exceeded.  He was advised to proceed with a home sleep  test, which he had on 02/10/2022, AHI was 27.5/h, O2 nadir 86%, mild to moderate snoring detected.  He was advised to start treatment with a new AutoPap machine.  His set up date was 03/10/2022, he has a ResMed AirSense 11 AutoSet machine.     I reviewed his AutoPap compliance data from 03/20/2022 through 04/18/2022, which is a total of 30 days, during which time he used his machine every night with percent use days greater than 4 hours at 100%, indicating superb compliance, average usage of 7 hours and 52 minutes, residual AHI 1.6/h, leak acceptable with the 95th percentile at 10.4 L/min, average pressure for the 95th percentile at 10.5 cm with a range of 9 cm to 11 cm with EPR.     I reviewed his CPAP compliance data from the past 90 days from 10/30/2021 through 01/27/2022, during which time he used his machine every night except for 1 night, percent use days greater than 4 hours at 99% which is excellent, residual AHI at goal at 1.4/h, pressure 9 cm without EPR.  Leak acceptable with the 95th percentile at 9.5.   I saw him in a VV on 02/14/2019, at which time he was compliant with his CPAP and doing well.  He was advised to follow-up routinely in 1 year.   He saw Butch Penny, NP on 02/14/2020, at which time he was compliant with his CPAP and doing well.   He saw  Butch Penny, NP on 02/11/2021 for his yearly checkup and was stable, doing well with his CPAP of 9 cm with good apnea control.      I saw him on 02/06/2018, at which time he was fully compliant with CPAP and doing well and encouraged to follow-up in 1 year.      I reviewed his CPAP compliance data from 01/08/2019 through 02/06/2019 which is a total of 30 days, during which time he used his machine every night with percent used days greater than 4 hours at 100%, indicating superb compliance with an average usage of 8 hours and 7 minutes which is great, residual AHI at goal at 1.7/h, leak acceptable with a 95th percentile at 16.6 L/min on a  pressure of 9 cm.   I saw him on 02/02/2017, at which time he was compliant with CPAP therapy. He had some recurrence of his morning headaches which were typically left-sided. His exam is nonfocal, nevertheless I suggested we proceed with a brain scan to rule out a structural cause of his one-sided headaches. His brain MRI with and without contrast on 03/30/2017 which showed normal findings. We called him with his test results.    I reviewed his CPAP compliance data from 01/03/2018 through 02/01/2018 which is a total of 30 days, during which time he used his CPAP every night with percent used days greater than 4 hours at 100%, indicating superb compliance with an average usage of 8 hours and 5 minutes, residual AHI at goal at 1.4 per hour, leak acceptable with the 95th percentile at 13.4 L/m on a pressure of 9 cm.    I saw him on 02/04/2016, at which time he reported doing well. He was trying to lose weight. He had a change in his job, was traveling less, less stress. He was compliant with CPAP therapy.    I reviewed his CPAP compliance data from 01/02/2017 through 01/31/2017 which is a total of 30 days, during which time he used his CPAP 27 days with percent used days greater than 4 hours at 90%, indicating excellent compliance with an average usage of 8 hours and 24 minutes, residual AHI 1.7 per hour, leak acceptable with the 95th percentile at 10.6 L/m on a pressure of 9 cm.  I saw him on 01/29/2015, at which time he was doing well. He was compliant with treatment. He had no new complaints. I suggested a one-year checkup.   I reviewed his CPAP compliance data from 01/04/2016 through 02/02/2016 which is a total of 30 days during which time he used his CPAP 28 days with percent used days greater than 4 hours at 93%, indicating excellent compliance with an average usage of 7 hours and 40 minutes, residual AHI 1.9 per hour, leak acceptable for the 95th percentile at 12 L/m on a pressure of 9 cm.   I saw  him on 07/29/2014, at which time we discussed sleep test results. He was compliant with treatment and reported doing well, , he was sleeping better and felt more rested. He had some mild nasal soreness and congestion. His nocturia was improved, and his morning headaches were essentially gone.    I reviewed his CPAP compliance data from 12/29/2014 through 01/27/2015 which is a total of 30 days during which time he used his machine every night except for 1 night. Percent used days greater than 4 hours was 93%, indicating excellent compliance with an average usage of 7 hours and 25 minutes, residual AHI good at  2 per hour and leak acceptable with the 95th percentile at 9.7 L/m on a pressure of 9 cm with EPR of 1.    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 He reported recurrent morning headaches and daytime somnolence with mild snoring reported. I asked him to return for a sleep study.    He had a split-night sleep study on 01/13/2014 and went over his test results with him in detail today. His baseline sleep efficiency was reduced at 80.7% with a latency to sleep of 11 minutes and wake after sleep onset of 18 minutes with moderate sleep fragmentation noted. He had an elevated arousal index. He had elevated stage II sleep, 21.5% of slow-wave sleep, and absence of REM sleep. He had moderate snoring. Total AHI was 39.2 per hour. Baseline oxygen saturation was 93%, nadir was 88%. He was then titrated on CPAP. Sleep efficiency was 82.7%, latency to sleep of 17.5 minutes and wake after sleep onset of 36.5 minutes with mild sleep fragmentation noted. His arousal index was improved. There was an increased percentage of REM sleep. Average oxygen saturation was 94%, nadir of 84%. He had no significant periodic leg movements of sleep before or after CPAP. He was titrated from 5-9 cm of water. AHI was  reduced to 0.7 per hour at the final pressure with supine REM sleep achieved.    I reviewed the patient's CPAP compliance data from 03/19/2014 to 04/17/2014, which is a total of 30 days, during which time the patient used CPAP every day. The average usage for all days was 7 hours and 37 minutes. The percent used days greater than 4 hours was 93 %, indicating excellent compliance. The residual AHI was 1.8 per hour, indicating an adequate treatment pressure of 9 cwp with EPR of 1. Air leak from the mask was very low.   I reviewed the patient's CPAP compliance data from 06/01/2014 to 06/30/2014, which is a total of 30 days, during which time the patient used CPAP every day except for 2 days. The average usage for all days was 7 hours and 17 minutes. The percent used days greater than 4 hours was 90 %, indicating excellent compliance. The residual AHI was 2.8 per hour, indicating an appropriate treatment pressure of 9 cwp with EPR of 1. Air leak from the mask was acceptable and typically low.   I reviewed his compliance data from 06/26/2014 through 07/25/2014 which is a total of 30 days during which time he used his machine every night except for one night. Percent used days greater than 4 hours was 93%, indicating excellent compliance, residual AHI 2.4 per hour, leak low. Pressure at 9 cm with EPR of 1. Average usage of 7 hours and 37 minutes.   His typical bedtime is reported to be around 9 to 10:30 PM and usual wake time is around 6:45 to 7:30 AM. Sleep onset typically occurs within 15-30 minutes. He reports feeling marginally rested upon awakening. He wakes up on an average 1 times in the middle of the night and has to go to the bathroom 0 times on a typical night. He reports frequent morning headaches, which are L sided.   He denies frank excessive daytime somnolence (EDS) and His Epworth Sleepiness Score (ESS) is 5/24 today. He has not fallen asleep while driving. The patient has not been taking a planned  nap. 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 He snores mildly and it is unclear, if there are any apneas. He himself does not note any gasping sensations while asleep. Somehow or another he still does not wake up rested. He does achieve a good amount of sleep. His mother has sleep apnea and is on a CPAP machine. His mother also has RLS symptoms. He has some restless leg symptoms and describes an aching sensation encouraged to move his right leg only. It helps to get up and walk around and stretch his right leg after which his symptoms subside typically. He grinds his teeth and has been using an over-the-counter bite guard. He wakes up with nightmares sometimes. He does talk in his sleep. He does not walk in his sleep. He has acid reflux symptoms sometimes and often wakes up with a dry mouth from mouth breathing and has mucus. He does not typically suffer from postnasal drip. He drinks quite a bit of caffeine. He drinks soda approximately 4 times a day, and tries not to drink any caffeine after 4 PM. He does not smoke and rarely drinks alcohol.   His Past Medical History Is Significant For: Past Medical History:  Diagnosis Date   Anxiety    Asthma    GERD (gastroesophageal reflux disease)     His Past Surgical History Is Significant For: Past Surgical History:  Procedure Laterality Date   FINGER DEBRIDEMENT  07/2003   WISDOM TOOTH EXTRACTION  2001    His Family History Is Significant For: Family History  Problem Relation Age of Onset   Lung cancer Maternal Grandfather    Prostate cancer Paternal Grandfather    Sleep apnea Mother    Diabetes Paternal Grandmother    Arthritis Father     His Social History Is Significant For: Social History   Socioeconomic History   Marital status: Married    Spouse name: Cana   Number of children: Not on file   Years of  education: 12+   Highest education level: Not on file  Occupational History    Employer: [employer]    Comment: Works at [employer]  Tobacco Use   Smoking status: Never   Smokeless tobacco: Never  Substance and Sexual Activity   Alcohol use: Yes    Alcohol/week: 0.0 standard drinks of alcohol    Comment: 2 x monthly   Drug use: No   Sexual activity: Not on file  Other Topics Concern   Not on file  Social History Narrative   Patient consumes 2-3 caffeine drinks daily   Social Determinants of Health   Financial Resource Strain: Not on file  Food Insecurity: Not on file  Transportation Needs: Not on file  Physical Activity: Not on file  Stress: Not on file  Social Connections: Not on file    His Allergies Are:  Allergies  Allergen Reactions   Androgens Other (See Comments)    Steroids aggravate an eye condition     Codeine Nausea Only   Oxycodone Nausea Only  :   His Current Medications Are:  Outpatient Encounter Medications as of 02/22/2023  Medication Sig   buPROPion (WELLBUTRIN XL) 150 MG 24 hr tablet Take 150 mg by mouth every morning.   meloxicam (MOBIC) 7.5 MG tablet Take 7.5 mg by mouth 2 (two) times daily.   Multiple Vitamin (MULTIVITAMIN) tablet Take 1 tablet by  mouth daily.   Facility-Administered Encounter Medications as of 02/22/2023  Medication   gadopentetate dimeglumine (MAGNEVIST) injection 20 mL  :  Review of Systems:  Out of a complete 14 point review of systems, all are reviewed and negative with the exception of these symptoms as listed below:   Review of Systems  Neurological:        Pt here for CPAP  f/u Pt states no questions or concerns for today's visit    ESS:7    Objective:  Neurological Exam  Physical Exam Physical Examination:   Vitals:   02/22/23 0853  BP: 136/83  Pulse: 77    General Examination: The patient is a very pleasant 42 y.o. male in no acute distress. He appears well-developed and  well-nourished and well groomed.   HEENT: Normocephalic, atraumatic, pupils are equal, tracking well-preserved.  Face is symmetric with normal facial animation, speech is clear without dysarthria, hypophonia or voice tremor, neck with full range of motion, no carotid bruits.  Airway examination reveals stable findings.    Chest: Clear to auscultation without wheezing, rhonchi or crackles noted.   Heart: S1+S2+0, regular and normal without murmurs, rubs or gallops noted.    Abdomen: Soft, non-tender and non-distended.   Skin: Warm and dry without trophic changes noted.    Musculoskeletal: exam reveals no obvious joint deformities, mild arthritic changes right and left index fingers.   Neurologically:  Mental status: The patient is awake, alert and oriented in all 4 spheres. His immediate and remote memory, attention, language skills and fund of knowledge are appropriate. There is no evidence of aphasia, agnosia, apraxia or anomia. Speech is clear with normal prosody and enunciation. Thought process is linear. Mood is normal and affect is normal.  Cranial nerves II - XII are as described above under HEENT exam. Motor exam: Normal bulk, moves all 4 extremities without limitation.  There is no obvious resting or action tremor.   Fine motor skills and coordination: Grossly intact.  Cerebellar testing: No dysmetria or intention tremor. There is no truncal or gait ataxia.  Sensory exam: intact to light touch.  Gait, station and balance: He stands easily. No veering to one side is noted. No leaning to one side is noted. Posture is age-appropriate and stance is narrow based. Gait shows normal stride length and normal pace. No problems turning are noted.   Assessment and Plan:    In summary, Caleb Vega is a very pleasant 42 year old male with an underlying medical history of reflux disease, asthma, anxiety, and obesity, who presents for follow-up consultation of his obstructive sleep apnea,  well-established on treatment with CPAP since June 2015 with good results and ongoing excellent compliance.  He had a reevaluation with a home sleep test on 02/10/2022.  His AHI was 27.5/h, O2 nadir 86% with mild to moderate snoring detected.  He is now established on his new AutoPap machine since 03/10/2022.  He continues to be fully compliant and benefits from treatment.  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 His AHI is at goal, leak acceptable, he uses nasal pillows. He is commended for his treatment adherence and advised to follow-up routinely next year, he can see Butch Penny, NP, we can offer him a video visit if he likes.  I answered all his questions today and he was in agreement.

## 2023-03-16 ENCOUNTER — Other Ambulatory Visit (HOSPITAL_BASED_OUTPATIENT_CLINIC_OR_DEPARTMENT_OTHER): Payer: Self-pay | Admitting: Family Medicine

## 2023-03-16 DIAGNOSIS — E669 Obesity, unspecified: Secondary | ICD-10-CM | POA: Diagnosis not present

## 2023-03-16 DIAGNOSIS — F909 Attention-deficit hyperactivity disorder, unspecified type: Secondary | ICD-10-CM | POA: Diagnosis not present

## 2023-03-16 DIAGNOSIS — F411 Generalized anxiety disorder: Secondary | ICD-10-CM | POA: Diagnosis not present

## 2023-03-16 DIAGNOSIS — R0789 Other chest pain: Secondary | ICD-10-CM | POA: Diagnosis not present

## 2023-03-31 ENCOUNTER — Ambulatory Visit (HOSPITAL_COMMUNITY): Payer: 59

## 2023-04-20 ENCOUNTER — Ambulatory Visit (HOSPITAL_COMMUNITY)
Admission: RE | Admit: 2023-04-20 | Discharge: 2023-04-20 | Disposition: A | Discharge status: Home / Self Care | Payer: BC Managed Care – PPO | Source: Ambulatory Visit | Attending: Family Medicine | Admitting: Family Medicine

## 2023-04-20 DIAGNOSIS — E669 Obesity, unspecified: Secondary | ICD-10-CM | POA: Insufficient documentation

## 2023-04-22 DIAGNOSIS — F909 Attention-deficit hyperactivity disorder, unspecified type: Secondary | ICD-10-CM | POA: Diagnosis not present

## 2023-04-22 DIAGNOSIS — F411 Generalized anxiety disorder: Secondary | ICD-10-CM | POA: Diagnosis not present

## 2023-04-22 DIAGNOSIS — Z6839 Body mass index (BMI) 39.0-39.9, adult: Secondary | ICD-10-CM | POA: Diagnosis not present

## 2023-05-18 DIAGNOSIS — F909 Attention-deficit hyperactivity disorder, unspecified type: Secondary | ICD-10-CM | POA: Diagnosis not present

## 2023-08-08 DIAGNOSIS — G4733 Obstructive sleep apnea (adult) (pediatric): Secondary | ICD-10-CM | POA: Diagnosis not present

## 2023-09-07 DIAGNOSIS — G4733 Obstructive sleep apnea (adult) (pediatric): Secondary | ICD-10-CM | POA: Diagnosis not present

## 2023-09-28 DIAGNOSIS — Z0000 Encounter for general adult medical examination without abnormal findings: Secondary | ICD-10-CM | POA: Diagnosis not present

## 2023-09-28 DIAGNOSIS — Z125 Encounter for screening for malignant neoplasm of prostate: Secondary | ICD-10-CM | POA: Diagnosis not present

## 2023-09-28 DIAGNOSIS — Z13220 Encounter for screening for lipoid disorders: Secondary | ICD-10-CM | POA: Diagnosis not present

## 2023-10-04 DIAGNOSIS — Z0000 Encounter for general adult medical examination without abnormal findings: Secondary | ICD-10-CM | POA: Diagnosis not present

## 2023-10-04 DIAGNOSIS — F411 Generalized anxiety disorder: Secondary | ICD-10-CM | POA: Diagnosis not present

## 2023-10-04 DIAGNOSIS — Z23 Encounter for immunization: Secondary | ICD-10-CM | POA: Diagnosis not present

## 2023-10-04 DIAGNOSIS — F909 Attention-deficit hyperactivity disorder, unspecified type: Secondary | ICD-10-CM | POA: Diagnosis not present

## 2023-10-08 DIAGNOSIS — G4733 Obstructive sleep apnea (adult) (pediatric): Secondary | ICD-10-CM | POA: Diagnosis not present

## 2023-12-22 DIAGNOSIS — J309 Allergic rhinitis, unspecified: Secondary | ICD-10-CM | POA: Diagnosis not present

## 2023-12-22 DIAGNOSIS — R051 Acute cough: Secondary | ICD-10-CM | POA: Diagnosis not present

## 2023-12-22 DIAGNOSIS — Z03818 Encounter for observation for suspected exposure to other biological agents ruled out: Secondary | ICD-10-CM | POA: Diagnosis not present

## 2023-12-29 DIAGNOSIS — M26621 Arthralgia of right temporomandibular joint: Secondary | ICD-10-CM | POA: Diagnosis not present

## 2024-02-17 DIAGNOSIS — G4733 Obstructive sleep apnea (adult) (pediatric): Secondary | ICD-10-CM | POA: Diagnosis not present

## 2024-02-27 ENCOUNTER — Encounter: Payer: Self-pay | Admitting: *Deleted

## 2024-02-28 ENCOUNTER — Telehealth: Payer: BC Managed Care – PPO | Admitting: Adult Health

## 2024-02-28 DIAGNOSIS — G4733 Obstructive sleep apnea (adult) (pediatric): Secondary | ICD-10-CM

## 2024-02-28 NOTE — Progress Notes (Signed)
 PATIENT: Caleb Vega DOB: 1980/12/30  REASON FOR VISIT: follow up HISTORY FROM: patient PRIMARY NEUROLOGIST: Dr. Omar Bibber   Virtual Visit via Video Note  I connected with Caleb Vega on 02/28/24 at  1:15 PM EDT by a video enabled telemedicine application located remotely at [facility] and verified that I am speaking with the correct person using two identifiers who was located at their own home in [place].   I discussed the limitations of evaluation and management by telemedicine and the availability of in person appointments. The patient expressed understanding and agreed to proceed.   PATIENT: Caleb Vega DOB: 1981/05/14  REASON FOR VISIT: follow up HISTORY FROM: patient  HISTORY OF PRESENT ILLNESS: Today  02/28/24 Caleb Vega is a 43 y.o. male with a history of OSA on CPAP. Returns today for follow-up. Returns today for follow-up. Reports that CPAP is working well. Denies any new issues. Reports that he was started on long-acting methylphenidate. DL is below.        REVIEW OF SYSTEMS: Out of a complete 14 system review of symptoms, the patient complains only of the following symptoms, and all other reviewed systems are negative.  ALLERGIES: Allergies  Allergen Reactions   Androgens Other (See Comments)    Steroids aggravate an eye condition     Codeine Nausea Only   Oxycodone Nausea Only    HOME MEDICATIONS: Outpatient Medications Prior to Visit  Medication Sig Dispense Refill   buPROPion (WELLBUTRIN XL) 150 MG 24 hr tablet Take 150 mg by mouth every morning.     meloxicam (MOBIC) 7.5 MG tablet Take 7.5 mg by mouth 2 (two) times daily.     Multiple Vitamin (MULTIVITAMIN) tablet Take 1 tablet by mouth daily.     Facility-Administered Medications Prior to Visit  Medication Dose Route Frequency Provider Last Rate Last Admin   gadopentetate dimeglumine  (MAGNEVIST ) injection 20 mL  20 mL Intravenous Once PRN Athar, Saima, MD        PAST  MEDICAL HISTORY: Past Medical History:  Diagnosis Date   Anxiety    Asthma    GERD (gastroesophageal reflux disease)     PAST SURGICAL HISTORY: Past Surgical History:  Procedure Laterality Date   FINGER DEBRIDEMENT  07/2003   WISDOM TOOTH EXTRACTION  2001    FAMILY HISTORY: Family History  Problem Relation Age of Onset   Lung cancer Maternal Grandfather    Prostate cancer Paternal Grandfather    Sleep apnea Mother    Diabetes Paternal Grandmother    Arthritis Father     SOCIAL HISTORY: Social History   Socioeconomic History   Marital status: Married    Spouse name: Cana   Number of children: Not on file   Years of education: 12+   Highest education level: Not on file  Occupational History    Employer: [employer]    Comment: Works at [employer]  Tobacco Use   Smoking status: Never   Smokeless tobacco: Never  Substance and Sexual Activity   Alcohol use: Yes    Alcohol/week: 0.0 standard drinks of alcohol    Comment: 2 x monthly   Drug use: No   Sexual activity: Not on file  Other Topics Concern   Not on file  Social History Narrative   Patient consumes 2-3 caffeine drinks daily   Social Drivers of Health   Financial Resource Strain: Not on file  Food Insecurity: Not on file  Transportation Needs: Not  on file  Physical Activity: Not on file  Stress: Not on file  Social Connections: Not on file  Intimate Partner Violence: Not on file      PHYSICAL EXAM Generalized: Well developed, in no acute distress   Neurological examination  Mentation: Alert oriented to time, place, history taking. Follows all commands speech and language fluent Cranial nerve II-XII: Facial symmetry noted.   DIAGNOSTIC DATA (LABS, IMAGING, TESTING) - I reviewed patient records, labs, notes, testing and imaging myself where available.     ASSESSMENT AND PLAN 43 y.o. year old male  has a past medical history of Anxiety, Asthma, and GERD  (gastroesophageal reflux disease). here with:  OSA on CPAP  CPAP compliance excellent Residual AHI is good Encouraged patient to continue using CPAP nightly and > 4 hours each night F/U in 1 year or sooner if needed       Clem Currier, MSN, NP-C 02/28/2024, 7:58 AM [facility] [address], [address] [place], [place] [address] [phone]

## 2024-03-19 DIAGNOSIS — G4733 Obstructive sleep apnea (adult) (pediatric): Secondary | ICD-10-CM | POA: Diagnosis not present

## 2024-04-03 DIAGNOSIS — F909 Attention-deficit hyperactivity disorder, unspecified type: Secondary | ICD-10-CM | POA: Diagnosis not present

## 2024-04-03 DIAGNOSIS — Z1331 Encounter for screening for depression: Secondary | ICD-10-CM | POA: Diagnosis not present

## 2024-04-18 DIAGNOSIS — G4733 Obstructive sleep apnea (adult) (pediatric): Secondary | ICD-10-CM | POA: Diagnosis not present

## 2024-05-23 DIAGNOSIS — G4733 Obstructive sleep apnea (adult) (pediatric): Secondary | ICD-10-CM | POA: Diagnosis not present

## 2024-07-03 DIAGNOSIS — S80211A Abrasion, right knee, initial encounter: Secondary | ICD-10-CM | POA: Diagnosis not present

## 2024-07-05 DIAGNOSIS — M722 Plantar fascial fibromatosis: Secondary | ICD-10-CM | POA: Diagnosis not present

## 2024-07-05 DIAGNOSIS — M79672 Pain in left foot: Secondary | ICD-10-CM | POA: Diagnosis not present

## 2024-09-09 DIAGNOSIS — R0981 Nasal congestion: Secondary | ICD-10-CM | POA: Diagnosis not present

## 2024-09-09 DIAGNOSIS — R059 Cough, unspecified: Secondary | ICD-10-CM | POA: Diagnosis not present

## 2024-09-09 DIAGNOSIS — J019 Acute sinusitis, unspecified: Secondary | ICD-10-CM | POA: Diagnosis not present

## 2024-09-09 DIAGNOSIS — J209 Acute bronchitis, unspecified: Secondary | ICD-10-CM | POA: Diagnosis not present

## 2025-02-26 ENCOUNTER — Telehealth: Admitting: Adult Health
